# Patient Record
Sex: Male | Born: 1990 | Race: Black or African American | Hispanic: No | Marital: Single | Smoking: Light tobacco smoker
Health system: Southern US, Community
[De-identification: ages and names within clinical notes are randomized; demographics above are authoritative.]

## PROBLEM LIST (undated history)

## (undated) DIAGNOSIS — J45909 Unspecified asthma, uncomplicated: Secondary | ICD-10-CM

---

## 2013-12-29 ENCOUNTER — Emergency Department (HOSPITAL_COMMUNITY): Payer: BC Managed Care – PPO

## 2013-12-29 ENCOUNTER — Emergency Department (HOSPITAL_COMMUNITY)
Admission: EM | Admit: 2013-12-29 | Discharge: 2013-12-29 | Disposition: A | Discharge status: Home / Self Care | Payer: BC Managed Care – PPO | Attending: Emergency Medicine | Admitting: Emergency Medicine

## 2013-12-29 ENCOUNTER — Encounter (HOSPITAL_COMMUNITY): Payer: Self-pay | Admitting: Emergency Medicine

## 2013-12-29 DIAGNOSIS — IMO0002 Reserved for concepts with insufficient information to code with codable children: Secondary | ICD-10-CM | POA: Insufficient documentation

## 2013-12-29 DIAGNOSIS — J45901 Unspecified asthma with (acute) exacerbation: Secondary | ICD-10-CM | POA: Insufficient documentation

## 2013-12-29 DIAGNOSIS — R0989 Other specified symptoms and signs involving the circulatory and respiratory systems: Secondary | ICD-10-CM | POA: Diagnosis present

## 2013-12-29 DIAGNOSIS — J45909 Unspecified asthma, uncomplicated: Secondary | ICD-10-CM

## 2013-12-29 DIAGNOSIS — R0609 Other forms of dyspnea: Secondary | ICD-10-CM | POA: Diagnosis present

## 2013-12-29 HISTORY — DX: Unspecified asthma, uncomplicated: J45.909

## 2013-12-29 MED ORDER — PREDNISONE 20 MG PO TABS
60.0000 mg | ORAL_TABLET | Freq: Once | ORAL | Status: AC
  Administered 2013-12-29: 60 mg via ORAL
  Filled 2013-12-29: qty 3

## 2013-12-29 MED ORDER — PREDNISONE 10 MG PO TABS: 20.0000 mg | ORAL_TABLET | Freq: Every day | ORAL | Status: DC

## 2013-12-29 MED ORDER — ALBUTEROL SULFATE HFA 108 (90 BASE) MCG/ACT IN AERS: 1.0000 | INHALATION_SPRAY | Freq: Four times a day (QID) | RESPIRATORY_TRACT | Status: DC | PRN

## 2013-12-29 MED ORDER — ACETAMINOPHEN 325 MG PO TABS
650.0000 mg | ORAL_TABLET | Freq: Once | ORAL | Status: AC
  Administered 2013-12-29: 650 mg via ORAL
  Filled 2013-12-29: qty 2

## 2013-12-29 MED ORDER — ALBUTEROL SULFATE (2.5 MG/3ML) 0.083% IN NEBU
2.5000 mg | INHALATION_SOLUTION | RESPIRATORY_TRACT | Status: DC | PRN
  Administered 2013-12-29 (×2): 2.5 mg via RESPIRATORY_TRACT
  Filled 2013-12-29 (×2): qty 3

## 2013-12-29 NOTE — Discharge Instructions (Signed)
Asthma, Adult Asthma is a recurring condition in which the airways tighten and narrow. Asthma can make it difficult to breathe. It can cause coughing, wheezing, and shortness of breath. Asthma episodes (also called asthma attacks) range from minor to life-threatening. Asthma cannot be cured, but medicines and lifestyle changes can help control it. CAUSES Asthma is believed to be caused by inherited (genetic) and environmental factors, but its exact cause is unknown. Asthma may be triggered by allergens, lung infections, or irritants in the air. Asthma triggers are different for each person. Common triggers include:   Animal dander.  Dust mites.  Cockroaches.  Pollen from trees or grass.  Mold.  Smoke.  Air pollutants such as dust, household cleaners, hair sprays, aerosol sprays, paint fumes, strong chemicals, or strong odors.  Cold air, weather changes, and winds (which increase molds and pollens in the air).  Strong emotional expressions such as crying or laughing hard.  Stress.  Certain medicines (such as aspirin) or types of drugs (such as beta-blockers).  Sulfites in foods and drinks. Foods and drinks that may contain sulfites include dried fruit, potato chips, and sparkling grape juice.  Infections or inflammatory conditions such as the flu, a cold, or an inflammation of the nasal membranes (rhinitis).  Gastroesophageal reflux disease (GERD).  Exercise or strenuous activity. SYMPTOMS Symptoms may occur immediately after asthma is triggered or many hours later. Symptoms include:  Wheezing.  Excessive nighttime or early morning coughing.  Frequent or severe coughing with a common cold.  Chest tightness.  Shortness of breath. DIAGNOSIS  The diagnosis of asthma is made by a review of your medical history and a physical exam. Tests may also be performed. These may include:  Lung function studies. These tests show how much air you breath in and out.  Allergy  tests.  Imaging tests such as X-rays. TREATMENT  Asthma cannot be cured, but it can usually be controlled. Treatment involves identifying and avoiding your asthma triggers. It also involves medicines. There are 2 classes of medicine used for asthma treatment:   Controller medicines. These prevent asthma symptoms from occurring. They are usually taken every day.  Reliever or rescue medicines. These quickly relieve asthma symptoms. They are used as needed and provide short-term relief. Your health care provider will help you create an asthma action plan. An asthma action plan is a written plan for managing and treating your asthma attacks. It includes a list of your asthma triggers and how they may be avoided. It also includes information on when medicines should be taken and when their dosage should be changed. An action plan may also involve the use of a device called a peak flow meter. A peak flow meter measures how well the lungs are working. It helps you monitor your condition. HOME CARE INSTRUCTIONS   Take medicine as directed by your health care provider. Speak with your health care provider if you have questions about how or when to take the medicines.  Use a peak flow meter as directed by your health care provider. Record and keep track of readings.  Understand and use the action plan to help minimize or stop an asthma attack without needing to seek medical care.  Control your home environment in the following ways to help prevent asthma attacks:  Do not smoke. Avoid being exposed to secondhand smoke.  Change your heating and air conditioning filter regularly.  Limit your use of fireplaces and wood stoves.  Get rid of pests (such as roaches and   mice) and their droppings.  Throw away plants if you see mold on them.  Clean your floors and dust regularly. Use unscented cleaning products.  Try to have someone else vacuum for you regularly. Stay out of rooms while they are being  vacuumed and for a short while afterward. If you vacuum, use a dust mask from a hardware store, a double-layered or microfilter vacuum cleaner bag, or a vacuum cleaner with a HEPA filter.  Replace carpet with wood, tile, or vinyl flooring. Carpet can trap dander and dust.  Use allergy-proof pillows, mattress covers, and box spring covers.  Wash bed sheets and blankets every week in hot water and dry them in a dryer.  Use blankets that are made of polyester or cotton.  Clean bathrooms and kitchens with bleach. If possible, have someone repaint the walls in these rooms with mold-resistant paint. Keep out of the rooms that are being cleaned and painted.  Wash hands frequently. SEEK MEDICAL CARE IF:   You have wheezing, shortness of breath, or a cough even if taking medicine to prevent attacks.  The colored mucus you cough up (sputum) is thicker than usual.  Your sputum changes from clear or white to yellow, green, gray, or bloody.  You have any problems that may be related to the medicines you are taking (such as a rash, itching, swelling, or trouble breathing).  You are using a reliever medicine more than 2 3 times per week.  Your peak flow is still at 50 79% of you personal best after following your action plan for 1 hour. SEEK IMMEDIATE MEDICAL CARE IF:   You seem to be getting worse and are unresponsive to treatment during an asthma attack.  You are short of breath even at rest.  You get short of breath when doing very little physical activity.  You have difficulty eating, drinking, or talking due to asthma symptoms.  You develop chest pain.  You develop a fast heartbeat.  You have a bluish color to your lips or fingernails.  You are lightheaded, dizzy, or faint.  Your peak flow is less than 50% of your personal best.  You have a fever or persistent symptoms for more than 2 3 days.  You have a fever and symptoms suddenly get worse. MAKE SURE YOU:   Understand these  instructions.  Will watch your condition.  Will get help right away if you are not doing well or get worse. Document Released: 10/17/2005 Document Revised: 06/19/2013 Document Reviewed: 05/16/2013 [employer] Patient Information 2014 [employer].  

## 2013-12-29 NOTE — ED Provider Notes (Signed)
CSN: [id number]     Arrival date & time 12/29/13  1708 History   First MD Initiated Contact with Patient 12/29/13 1730     Chief Complaint  Patient presents with  . RUQ pain   . shob      HPI  Patient presents with dyspnea today. Has history of asthma as a kid. He has not had problems for over 10 years. 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 States he started getting short of breath this morning. He is "sore" in the right lower chest. Felt this was more from breathing or coughing. He felt more dyspneic and presents here  Past Medical History  Diagnosis Date  . Asthma    History reviewed. No pertinent past surgical history. No family history on file. History  Substance Use Topics  . Smoking status: Light Tobacco Smoker    Types: Cigars  . Smokeless tobacco: Never Used  . Alcohol Use: Yes     Comment: social     Review of Systems  Constitutional: Negative for fever, chills, diaphoresis, appetite change and fatigue.  HENT: Negative for mouth sores, sore throat and trouble swallowing.   Eyes: Negative for visual disturbance.  Respiratory: Positive for cough, shortness of breath and wheezing. Negative for chest tightness.   Cardiovascular: Positive for chest pain.  Gastrointestinal: Negative for nausea, vomiting, abdominal pain, diarrhea and abdominal distention.  Endocrine: Negative for polydipsia, polyphagia and polyuria.  Genitourinary: Negative for dysuria, frequency and hematuria.  Musculoskeletal: Negative for gait problem.  Skin: Negative for color change, pallor and rash.  Neurological: Negative for dizziness, syncope, light-headedness and headaches.  Hematological: Does not bruise/bleed easily.  Psychiatric/Behavioral: Negative for behavioral problems and confusion.      Allergies  Review of patient's allergies indicates no known allergies.  Home Medications   Current Outpatient Rx  Name  Route  Sig  Dispense  Refill  . Multiple Vitamin (MULTIVITAMIN  WITH MINERALS) TABS tablet   Oral   Take 1 tablet by mouth daily.         Marland Kitchen. albuterol (PROVENTIL HFA;VENTOLIN HFA) 108 (90 BASE) MCG/ACT inhaler   Inhalation   Inhale 1-2 puffs into the lungs every 6 (six) hours as needed for wheezing.   1 Inhaler   0   . predniSONE (DELTASONE) 10 MG tablet   Oral   Take 2 tablets (20 mg total) by mouth daily.   10 tablet   0    BP 130/70  Pulse 73  Temp(Src) 98.7 F (37.1 C) (Oral)  Resp 20  SpO2 98% Physical Exam  Constitutional: He is oriented to person, place, and time. He appears well-developed and well-nourished. No distress.  HENT:  Head: Normocephalic.  Eyes: Conjunctivae are normal. Pupils are equal, round, and reactive to light. No scleral icterus.  Neck: Normal range of motion. Neck supple. No thyromegaly present.  Cardiovascular: Normal rate and regular rhythm.  Exam reveals no gallop and no friction rub.   No murmur heard. Pulmonary/Chest: Effort normal. No respiratory distress. He has wheezes in the right upper field, the right lower field, the left upper field, the left middle field and the left lower field. He has no rales.  Mild prolongation. Overall good air exchange. Percent saturations. No focal diminished breath sounds.  Abdominal: Soft. Bowel sounds are normal. He exhibits no distension. There is no tenderness. There is no rebound.  Musculoskeletal: Normal range of motion.  Neurological: He is alert and oriented to person, place, and  time.  Skin: Skin is warm and dry. No rash noted.  Psychiatric: He has a normal mood and affect. His behavior is normal.    ED Course  Procedures (including critical care time) Labs Review Labs Reviewed - No data to display Imaging Review Dg Chest 2 View  12/29/2013   CLINICAL DATA:  Shortness of breath and chest pain.  EXAM: CHEST  2 VIEW  COMPARISON:  None.  FINDINGS: The heart size and mediastinal contours are within normal limits. Both lungs are clear. The visualized skeletal  structures are unremarkable.  IMPRESSION: No active cardiopulmonary disease.   Electronically Signed   By: Elberta Fortis M.D.   On: 12/29/2013 18:41     EKG Interpretation None      MDM   Final diagnoses:  Asthma    Patient receiving second breathing treatment. Had marked subjective relief after the first. On exam now is lungs are clear moving air well oxygenated. X-ray shows no acute processes. Plan to discharge home for corticosteroids, when necessary albuterol    Rolland Porter, MD 12/29/13 1905

## 2013-12-29 NOTE — ED Notes (Signed)
Pt states he was at work and and started having RUQ pain and shob and coughing a lot. Pt states he was fine when he got up this morning. Pt has PMH asthma when he was young and hasnt had problems in years.

## 2014-06-19 ENCOUNTER — Emergency Department (HOSPITAL_COMMUNITY)
Admission: EM | Admit: 2014-06-19 | Discharge: 2014-06-19 | Disposition: A | Discharge status: Home / Self Care | Payer: No Typology Code available for payment source | Attending: Emergency Medicine | Admitting: Emergency Medicine

## 2014-06-19 ENCOUNTER — Emergency Department (HOSPITAL_COMMUNITY): Payer: No Typology Code available for payment source

## 2014-06-19 ENCOUNTER — Encounter (HOSPITAL_COMMUNITY): Payer: Self-pay | Admitting: Emergency Medicine

## 2014-06-19 DIAGNOSIS — Z79899 Other long term (current) drug therapy: Secondary | ICD-10-CM | POA: Diagnosis not present

## 2014-06-19 DIAGNOSIS — Y92410 Unspecified street and highway as the place of occurrence of the external cause: Secondary | ICD-10-CM | POA: Diagnosis not present

## 2014-06-19 DIAGNOSIS — M25561 Pain in right knee: Secondary | ICD-10-CM

## 2014-06-19 DIAGNOSIS — S8990XA Unspecified injury of unspecified lower leg, initial encounter: Secondary | ICD-10-CM | POA: Insufficient documentation

## 2014-06-19 DIAGNOSIS — IMO0002 Reserved for concepts with insufficient information to code with codable children: Secondary | ICD-10-CM | POA: Diagnosis not present

## 2014-06-19 DIAGNOSIS — Y9389 Activity, other specified: Secondary | ICD-10-CM | POA: Insufficient documentation

## 2014-06-19 DIAGNOSIS — J45909 Unspecified asthma, uncomplicated: Secondary | ICD-10-CM | POA: Insufficient documentation

## 2014-06-19 DIAGNOSIS — F17200 Nicotine dependence, unspecified, uncomplicated: Secondary | ICD-10-CM | POA: Diagnosis not present

## 2014-06-19 DIAGNOSIS — S99919A Unspecified injury of unspecified ankle, initial encounter: Principal | ICD-10-CM

## 2014-06-19 DIAGNOSIS — S99929A Unspecified injury of unspecified foot, initial encounter: Principal | ICD-10-CM
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
## 2014-06-21 NOTE — ED Provider Notes (Signed)
Medical screening examination/treatment/procedure(s) were performed by non-physician practitioner and as supervising physician I was immediately available for consultation/collaboration.   EKG Interpretation None        Margarine Grosshans, DO 06/21/14 1555 

## 2014-07-10 ENCOUNTER — Encounter (HOSPITAL_COMMUNITY): Payer: Self-pay | Admitting: Emergency Medicine

## 2014-07-10 ENCOUNTER — Emergency Department (INDEPENDENT_AMBULATORY_CARE_PROVIDER_SITE_OTHER)
Admission: EM | Admit: 2014-07-10 | Discharge: 2014-07-10 | Disposition: A | Discharge status: Home / Self Care | Payer: Self-pay | Source: Home / Self Care | Attending: Family Medicine | Admitting: Family Medicine

## 2014-07-10 DIAGNOSIS — J40 Bronchitis, not specified as acute or chronic: Secondary | ICD-10-CM

## 2014-07-10 MED ORDER — IPRATROPIUM-ALBUTEROL 0.5-2.5 (3) MG/3ML IN SOLN
RESPIRATORY_TRACT | Status: AC
  Filled 2014-07-10: qty 3

## 2014-07-10 MED ORDER — IPRATROPIUM-ALBUTEROL 0.5-2.5 (3) MG/3ML IN SOLN
3.0000 mL | Freq: Once | RESPIRATORY_TRACT | Status: AC
  Administered 2014-07-10: 3 mL via RESPIRATORY_TRACT

## 2014-07-10 MED ORDER — ALBUTEROL SULFATE HFA 108 (90 BASE) MCG/ACT IN AERS: 1.0000 | INHALATION_SPRAY | Freq: Four times a day (QID) | RESPIRATORY_TRACT | Status: DC | PRN

## 2014-07-10 MED ORDER — PREDNISONE 10 MG PO TABS: 30.0000 mg | ORAL_TABLET | Freq: Every day | ORAL | Status: DC

## 2014-07-10 NOTE — Discharge Instructions (Signed)
Thank you for coming in today. °Call or go to the emergency room if you get worse, have trouble breathing, have chest pains, or palpitations.  ° °Acute Bronchitis °Bronchitis is inflammation of the airways that extend from the windpipe into the lungs (bronchi). The inflammation often causes mucus to develop. This leads to a cough, which is the most common symptom of bronchitis.  °In acute bronchitis, the condition usually develops suddenly and goes away over time, usually in a couple weeks. Smoking, allergies, and asthma can make bronchitis worse. Repeated episodes of bronchitis may cause further lung problems.  °CAUSES °Acute bronchitis is most often caused by the same virus that causes a cold. The virus can spread from person to person (contagious) through coughing, sneezing, and touching contaminated objects. °SIGNS AND SYMPTOMS  °· Cough.   °· Fever.   °· Coughing up mucus.   °· Body aches.   °· Chest congestion.   °· Chills.   °· Shortness of breath.   °· Sore throat.   °DIAGNOSIS  °Acute bronchitis is usually diagnosed through a physical exam. Your health care provider will also ask you questions about your medical history. Tests, such as chest X-rays, are sometimes done to rule out other conditions.  °TREATMENT  °Acute bronchitis usually goes away in a couple weeks. Oftentimes, no medical treatment is necessary. Medicines are sometimes given for relief of fever or cough. Antibiotic medicines are usually not needed but may be prescribed in certain situations. In some cases, an inhaler may be recommended to help reduce shortness of breath and control the cough. A cool mist vaporizer may also be used to help thin bronchial secretions and make it easier to clear the chest.  °HOME CARE INSTRUCTIONS °· Get plenty of rest.   °· Drink enough fluids to keep your urine clear or pale yellow (unless you have a medical condition that requires fluid restriction). Increasing fluids may help thin your respiratory secretions  (sputum) and reduce chest congestion, and it will prevent dehydration.   °· Take medicines only as directed by your health care provider. °· If you were prescribed an antibiotic medicine, finish it all even if you start to feel better. °· Avoid smoking and secondhand smoke. Exposure to cigarette smoke or irritating chemicals will make bronchitis worse. If you are a smoker, consider using nicotine gum or skin patches to help control withdrawal symptoms. Quitting smoking will help your lungs heal faster.   °· Reduce the chances of another bout of acute bronchitis by washing your hands frequently, avoiding people with cold symptoms, and trying not to touch your hands to your mouth, nose, or eyes.   °· Keep all follow-up visits as directed by your health care provider.   °SEEK MEDICAL CARE IF: °Your symptoms do not improve after 1 week of treatment.  °SEEK IMMEDIATE MEDICAL CARE IF: °· You develop an increased fever or chills.   °· You have chest pain.   °· You have severe shortness of breath. °· You have bloody sputum.   °· You develop dehydration. °· You faint or repeatedly feel like you are going to pass out. °· You develop repeated vomiting. °· You develop a severe headache. °MAKE SURE YOU:  °· Understand these instructions. °· Will watch your condition. °· Will get help right away if you are not doing well or get worse. °Document Released: 11/24/2004 Document Revised: 03/03/2014 Document Reviewed: 04/09/2013 °ExitCare® Patient Information ©2015 [employer]. This information is not intended to replace advice given to you by your health care provider. Make sure you discuss any questions you have with your   health care provider. ° °

## 2014-07-10 NOTE — ED Notes (Signed)
C/o  Chest tightness.  States "I feel like my equilibrium is off".  Mild nausea.  Denies any other symptoms. X 2 wks.  Also c/o a lesion on the left upper shoulder noticed 4 days ago.  No otc meds used.

## 2014-07-10 NOTE — ED Provider Notes (Signed)
Edward Pacheco is a 23 y.o. male who presents to Urgent Care today for chest tightness. Patient has a two-week history of mild chest tightness. He notes occasional coughing and small amount of wheezing. No fevers or chills vomiting or diarrhea. No abdominal pain. Patient has not tried any medication. He additionally notes a mild sore throat. He feels well.    Past Medical History  Diagnosis Date  . Asthma    History  Substance Use Topics  . Smoking status: Light Tobacco Smoker    Types: Cigars  . Smokeless tobacco: Never Used  . Alcohol Use: Yes     Comment: social    ROS as above Medications: No current facility-administered medications for this encounter.   Current Outpatient Prescriptions  Medication Sig Dispense Refill  . albuterol (PROVENTIL HFA;VENTOLIN HFA) 108 (90 BASE) MCG/ACT inhaler Inhale 1-2 puffs into the lungs every 6 (six) hours as needed for wheezing.  1 Inhaler  0  . Multiple Vitamin (MULTIVITAMIN WITH MINERALS) TABS tablet Take 1 tablet by mouth daily.      . predniSONE (DELTASONE) 10 MG tablet Take 2 tablets (20 mg total) by mouth daily.  10 tablet  0    Exam:  Temp(Src) 98.7 F (37.1 C) (Oral)  Resp 14  SpO2 100% Gen: Well NAD HEENT: EOMI,  MMM normal posterior pharynx Lungs: Normal work of breathing. CTABL Heart: RRR no MRG Abd: NABS, Soft. Nondistended, Nontender Exts: Brisk capillary refill, warm and well perfused.   Patient was given a DuoNeb nebulizer treatment and felt better  No results found for this or any previous visit (from the past 24 hour(s)). No results found.  Assessment and Plan: 23 y.o. male with viral URI complicated by history of asthma. Possible bronchitis. Plan to treat with prednisone and albuterol.  Discussed warning signs or symptoms. Please see discharge instructions. Patient expresses understanding.   This note was created using Conservation officer, historic buildings. Any transcription errors are unintended.    Rodolph Bong,  MD 07/10/14 [phone]

## 2014-11-16 ENCOUNTER — Emergency Department (HOSPITAL_COMMUNITY)
Admission: EM | Admit: 2014-11-16 | Discharge: 2014-11-16 | Disposition: A | Discharge status: Home / Self Care | Payer: Self-pay | Attending: Emergency Medicine | Admitting: Emergency Medicine

## 2014-11-16 ENCOUNTER — Encounter (HOSPITAL_COMMUNITY): Payer: Self-pay | Admitting: Emergency Medicine

## 2014-11-16 DIAGNOSIS — L2389 Allergic contact dermatitis due to other agents: Secondary | ICD-10-CM | POA: Insufficient documentation

## 2014-11-16 DIAGNOSIS — Z79899 Other long term (current) drug therapy: Secondary | ICD-10-CM | POA: Insufficient documentation

## 2014-11-16 DIAGNOSIS — Z720 Tobacco use: Secondary | ICD-10-CM | POA: Insufficient documentation

## 2014-11-16 DIAGNOSIS — J45909 Unspecified asthma, uncomplicated: Secondary | ICD-10-CM | POA: Insufficient documentation

## 2014-11-16 DIAGNOSIS — L259 Unspecified contact dermatitis, unspecified cause: Secondary | ICD-10-CM

## 2014-11-16 MED ORDER — HYDROCORTISONE 1 % EX CREA: TOPICAL_CREAM | CUTANEOUS | Status: DC

## 2014-11-16 NOTE — Discharge Instructions (Signed)
Contact Dermatitis °Contact dermatitis is a reaction to certain substances that touch the skin. Contact dermatitis can be either irritant contact dermatitis or allergic contact dermatitis. Irritant contact dermatitis does not require previous exposure to the substance for a reaction to occur. Allergic contact dermatitis only occurs if you have been exposed to the substance before. Upon a repeat exposure, your body reacts to the substance.  °CAUSES  °Many substances can cause contact dermatitis. Irritant dermatitis is most commonly caused by repeated exposure to mildly irritating substances, such as: °· Makeup. °· Soaps. °· Detergents. °· Bleaches. °· Acids. °· Metal salts, such as nickel. °Allergic contact dermatitis is most commonly caused by exposure to: °· Poisonous plants. °· Chemicals (deodorants, shampoos). °· Jewelry. °· Latex. °· Neomycin in triple antibiotic cream. °· Preservatives in products, including clothing. °SYMPTOMS  °The area of skin that is exposed may develop: °· Dryness or flaking. °· Redness. °· Cracks. °· Itching. °· Pain or a burning sensation. °· Blisters. °With allergic contact dermatitis, there may also be swelling in areas such as the eyelids, mouth, or genitals.  °DIAGNOSIS  °Your caregiver can usually tell what the problem is by doing a physical exam. In cases where the cause is uncertain and an allergic contact dermatitis is suspected, a patch skin test may be performed to help determine the cause of your dermatitis. °TREATMENT °Treatment includes protecting the skin from further contact with the irritating substance by avoiding that substance if possible. Barrier creams, powders, and gloves may be helpful. Your caregiver may also recommend: °· Steroid creams or ointments applied 2 times daily. For best results, soak the rash area in cool water for 20 minutes. Then apply the medicine. Cover the area with a plastic wrap. You can store the steroid cream in the refrigerator for a "chilly"  effect on your rash. That may decrease itching. Oral steroid medicines may be needed in more severe cases. °· Antibiotics or antibacterial ointments if a skin infection is present. °· Antihistamine lotion or an antihistamine taken by mouth to ease itching. °· Lubricants to keep moisture in your skin. °· Burow's solution to reduce redness and soreness or to dry a weeping rash. Mix one packet or tablet of solution in 2 cups cool water. Dip a clean washcloth in the mixture, wring it out a bit, and put it on the affected area. Leave the cloth in place for 30 minutes. Do this as often as possible throughout the day. °· Taking several cornstarch or baking soda baths daily if the area is too large to cover with a washcloth. °Harsh chemicals, such as alkalis or acids, can cause skin damage that is like a burn. You should flush your skin for 15 to 20 minutes with cold water after such an exposure. You should also seek immediate medical care after exposure. Bandages (dressings), antibiotics, and pain medicine may be needed for severely irritated skin.  °HOME CARE INSTRUCTIONS °· Avoid the substance that caused your reaction. °· Keep the area of skin that is affected away from hot water, soap, sunlight, chemicals, acidic substances, or anything else that would irritate your skin. °· Do not scratch the rash. Scratching may cause the rash to become infected. °· You may take cool baths to help stop the itching. °· Only take over-the-counter or prescription medicines as directed by your caregiver. °· See your caregiver for follow-up care as directed to make sure your skin is healing properly. °SEEK MEDICAL CARE IF:  °· Your condition is not better after 3   days of treatment. °· You seem to be getting worse. °· You see signs of infection such as swelling, tenderness, redness, soreness, or warmth in the affected area. °· You have any problems related to your medicines. °Document Released: 10/14/2000 Document Revised: 01/09/2012  Document Reviewed: 03/22/2011 °ExitCare® Patient Information ©2015 [employer]. This information is not intended to replace advice given to you by your health care provider. Make sure you discuss any questions you have with your health care provider. ° ° °Emergency Department Resource Guide °1) Find a Doctor and Pay Out of Pocket °Although you won't have to find out who is covered by your insurance plan, it is a good idea to ask around and get recommendations. You will then need to call the office and see if the doctor you have chosen will accept you as a new patient and what types of options they offer for patients who are self-pay. Some doctors offer discounts or will set up payment plans for their patients who do not have insurance, but you will need to ask so you aren't surprised when you get to your appointment. ° °2) Contact Your Local Health Department °Not all health departments have doctors that can see patients for sick visits, but many do, so it is worth a call to see if yours does. If you don't know where your local health department is, you can check in your phone book. The CDC also has a tool to help you locate your state's health department, and many state websites also have listings of all of their local health departments. ° °3) Find a Walk-in Clinic °If your illness is not likely to be very severe or complicated, you may want to try a walk in clinic. These are popping up all over the country in pharmacies, drugstores, and shopping centers. They're usually staffed by nurse practitioners or physician assistants that have been trained to treat common illnesses and complaints. They're usually fairly quick and inexpensive. However, if you have serious medical issues or chronic medical problems, these are probably not your best option. ° °No Primary Care Doctor: °- Call [facility] at  832-8000 - they can help you locate a primary care doctor that  accepts your insurance, provides certain services,  etc. °- Physician Referral Service- 1-800-533-3463 ° °Chronic Pain Problems: °Organization         Address  Phone   Notes  °[facility]  (336) 297-2271 Patients need to be referred by their primary care doctor.  ° °Medication Assistance: °Organization         Address  Phone   Notes  °[place] [facility] [address] °[place], [place] [address] (336) 641-8030 --Must be a resident of [place] °-- Must have NO insurance coverage whatsoever (no Medicaid/ Medicare, etc.) °-- The pt. MUST have a primary care doctor that directs their care regularly and follows them in the community °  °[facility]  (866) 331-1348   °[employer]  (888) 892-1162   ° °Agencies that provide inexpensive medical care: °Organization         Address  Phone   Notes  °[facility]  (336) 832-8035   °[facility]    (336) 832-7272   °[facility] [address] °[place], [place] [address] (336) 832-4777   °[facility] [address], °[place] (336) 271-4999   °[facility]    (336) 373-0678   °[facility]    (336) 272-1050   °[facility] ° [address], [place] Phone:  (336) 832-4444, Fax:  (336) 832-4440 Hours of Operation:  9 am - 6 pm, M-F.  Also accepts Medicaid/Medicare and self-pay.  °[facility] ° [address], [place] Phone: (336) 832-3150, Fax: (336) 832-3151. Hours of Operation:  8:30 am - 5:30 pm, M-F.  Also accepts Medicaid and self-pay.  °[facility] [place] [address], [place] Phone: (336) 878-6027   °[facility] [address], [place], [place] (336)723-1848, Ext. 123 Mondays & Thursdays: 7-9 AM.  First 15 patients are seen on a first come, first serve basis. °  ° °Medicaid-accepting [place] Providers: ° °Organization         Address  Phone   Notes  °[facility] [address], [place] (336) 641-2100 Also accepts self-pay patients.  °[facility] [address], [place] ° (336) 856-9996   °[facility] [address], [place] (336) 288-8857   °[facility] [address] [place] [address], [place] (336) 299-7000   °Veita Bland [address], [place]  ° (336) 373-1557 Only accepts  Access Medicaid patients after they have their name applied to their card.  ° °Self-Pay (no insurance) in [place]: ° °Organization         Address  Phone   Notes  °Sickle Cell Patients, [facility] [address], [place] (336) 832-1970   °[facility] [address], [place] (336) 832-4400   °[facility] ° [address], [place] (336) 992-4800   °[facility]/Dr. Osei-Bonsu ° [address] [place] [address], [place] or [address], [place] (336) [phone] Phone number for both [place] and [place] locations is the same.  °[facility] [address], [place] (336) 299-0000   °[facility] [address] [place] [address], [place] or [address] (336) 852-7530 °(336) 878-2260   °[facility] [address],  (336) 350-1642, phone; (336) 294-5005, fax Sees patients 1st and 3rd Saturday of every month.  Must not qualify for public or private insurance (i.e. Medicaid, Medicare, [place] Health Choice, Veterans' Benefits) • Household income should be no more than 200% of the poverty level •The clinic cannot treat you if you are pregnant or think you are pregnant • Sexually transmitted diseases are not treated at the clinic.  ° ° °Dental Care: °Organization         Address  Phone  Notes  °[facility] [address],  (336) 641-6152 Accepts children up to  age 21 who are enrolled in Medicaid or [place] Health Choice; pregnant women with a Medicaid card; and children who have applied for Medicaid or [place] Health Choice, but were declined, whose parents can pay a reduced fee at time of service.  °[facility] [place]  [address], [place] (336) 641-7733 Accepts children up to age 21 who are enrolled in Medicaid or [place] Health Choice; pregnant women with a Medicaid card; and children who have applied for Medicaid or [place] Health Choice, but were declined, whose parents can pay a reduced fee at time of service.  °  [facility] ° [address], [place] (336) 641-4533 Patients are seen by appointment only. Walk-ins are not accepted. [facility] will see patients 18 years of age and older. °Monday - Tuesday (8am-5pm) °Most Wednesdays (8:30-5pm) °$30 per visit, cash only  °[facility] ° [address], [place] (336) 641-4533 Patients are seen by appointment only. Walk-ins are not accepted. [facility] will see patients 18 years of age and older. °One Wednesday Evening (Monthly: Volunteer Based).  $30 per visit, cash only  °[facility]  (919) 537-3737 for adults; Children under age 4, call Graduate Pediatric Dentistry at ([phone]) [phone]. Children aged 4-14, please call (919) [phone] to request a pediatric application. ° Dental services are provided in all areas of dental care including fillings, crowns and bridges, complete and partial dentures, implants, gum treatment, root canals, and extractions. Preventive care is also provided. Treatment is provided to both adults and children. °Patients are selected via a lottery and there is often a waiting list. °  °[facility] [address], °[place] ° (336) 763-8833 [email or web address] °  °[facility] [address], [place], [place] (336)723-1848, Ext. 123 Second and Fourth Thursday of  each month, opens at 6:30 AM; Clinic ends at 9 AM.  Patients are seen on a first-come first-served basis, and a limited number are seen during each clinic.  ° °[facility] ° [address], [place], [place] (336) 723-7904   Eligibility Requirements °You must have lived in [place], [place], or [place] counties for at least the last three months. °  You cannot be eligible for state or federal sponsored healthcare insurance, including Veterans Administration, Medicaid, or Medicare. °  You generally cannot be eligible for healthcare insurance through your employer.  °  How to apply: °Eligibility screenings are held every Tuesday and Wednesday afternoon from 1:00 pm until 4:00 pm. You do not need an appointment for the interview!  °[facility] [address], [place], [place] 336-631-2330   °[facility]  336-342-8273   °[facility]  336-703-3100   °[facility]  336-570-6415   ° °Behavioral Health Resources in the Community: °Intensive Outpatient Programs °Organization         Address  Phone  Notes  °[facility] [address], [place], [place] 336-878-6098   °[facility] [address], [place], [place] 336-832-9800   °[facility] [address], [place], [place] ° 336-882-2125   °[facility] [address],  °[place], [place] 1-800-853-5163 or 336-641-4981   °Substance Abuse Resources °Organization         Address  Phone  Notes  °[facility]  336-882-2125   °[facility]  336-784-9470   °[facility]  336-285-9073   °[facility]  336-845-3988   °Residential & Outpatient Substance Abuse Program  1-800-659-3381   °Psychological Services °Organization         Address  Phone  Notes  °[facility]  336- 832-9600   °[facility]  336- 378-7881   °[facility] [address],  [place] 1-800-853-5163 or 336-641-4981   ° °Mobile Crisis Teams °Organization         Address  Phone  Notes  °[facility], Mobile Crisis Care Unit  1-877-626-1772   °Assertive °[facility]otherapeutic Services ° [address] [place], [place] 336-834-9664   °Sharon DeEsch [address] °[place] 336-554-5454   ° °Self-Help/Support Groups °Organization         Address  Phone             Notes  °[facility] - variety of support groups  336- 373-1402 Call for more information  °Narcotics Anonymous (NA), [facility] [address], °[place]  2 meetings at this location  ° °Residential Treatment Programs °Organization         Address  Phone  Notes  °[facility] [address],    °[place]  1-866-801-8205   °[facility] ° [address], [place], [place] 704-293-8524   °[facility] [address], [place] 336-845-3988 Admissions: 8am-3pm M-F  °[facility] [address]    °[place], [place] 336-841-1104   °[facility] [address], [place], [place] 336-379-7146   °[facility] [address]  °[place], [place] 336-285-9073   °[facility] Intensive Outpatient [address], [place], [place] 336-852-3033   °[facility] [address]  °[place], [place] 1-877-615-2722 or 336-784-9470   °[facility] [address] [place], [place] 336-227-7417 Accepts Medicaid  °[facility] [address]  °[place] 1-800-659-3381 Substance Abuse/Addiction Treatment  ° °[place] Behavioral Health Resources °Organization         Address  Phone  Notes  °[facility]  (888) 581-9988   °Julie Brannon, PhD [address] [place], [place]   (336) 349-5553 or (336) 951-0000   °[facility]   [address] °[place], [place] (336) 349-4454     °[facility] [address], [place], [place] (336) 342-8316 Insurance/Medicaid/sponsorship through [facility]  °[facility] [address]                                    [place], [place] (336) 342-8316 Therapy/tele-psych/case  °[facility] [address]  ° [place], [place] (336) 349-2233    °Dr. Arfeen  (336) 349-4544   °[facility] [place]  [employer] [place] [facility] 1) [address], [place] °2) [address], [place] °3)  [address], [place] (336) 349-3220 °(336) 342-7768 ° °(336) 342-8140   °[place] [facility] (336) 342-1394 or (336) 342-3537 (After Hours)    ° ° ° ° °

## 2014-11-16 NOTE — ED Notes (Signed)
Pt states that he has had discharge coming from spot on the shaft of his penis. Pt states that he has been bathing trying to keep it clean and states that he has been applying coco butter to the area.

## 2014-11-16 NOTE — ED Provider Notes (Addendum)
CSN: [id number]     Arrival date & time 11/16/14  1706 History   First MD Initiated Contact with Patient 11/16/14 1724     Chief Complaint  Patient presents with  . Penile Discharge     (Consider location/radiation/quality/duration/timing/severity/associated sxs/prior Treatment) HPI Comments: Having some weeping around the skin of his penis. No lesions. Patient has changed detergents.  Patient is a 24 y.o. male presenting with penile discharge. The history is provided by the patient.  Penile Discharge This is a new problem. The current episode started more than 2 days ago. The problem occurs constantly. The problem has not changed since onset.Pertinent negatives include no abdominal pain and no shortness of breath. Nothing aggravates the symptoms. Nothing relieves the symptoms.    Past Medical History  Diagnosis Date  . Asthma    History reviewed. No pertinent past surgical history. No family history on file. History  Substance Use Topics  . Smoking status: Light Tobacco Smoker    Types: Cigars  . Smokeless tobacco: Never Used  . Alcohol Use: Yes     Comment: social     Review of Systems  Constitutional: Negative for fever.  Respiratory: Negative for cough and shortness of breath.   Gastrointestinal: Negative for vomiting and abdominal pain.  Genitourinary: Positive for discharge.  All other systems reviewed and are negative.     Allergies  Peanut-containing drug products and Lactose intolerance (gi)  Home Medications   Prior to Admission medications   Medication Sig Start Date End Date Taking? Authorizing Provider  albuterol (PROVENTIL HFA;VENTOLIN HFA) 108 (90 BASE) MCG/ACT inhaler Inhale 1-2 puffs into the lungs every 6 (six) hours as needed for wheezing. 07/10/14  Yes Rodolph Bong, MD  hydrocortisone cream 1 % Apply to affected area 2 times daily 11/16/14   Elwin Mocha, MD  predniSONE (DELTASONE) 10 MG tablet Take 3 tablets (30 mg total) by mouth daily. Patient  not taking: Reported on 11/16/2014 07/10/14   Rodolph Bong, MD   BP 144/80 mmHg  Pulse 76  Temp(Src) 98.1 F (36.7 C) (Oral)  Resp 17  Ht  (1.702 m)  Wt 174 lb 5 oz (79.068 kg)  BMI 27.29 kg/m2  SpO2 100% Physical Exam  Constitutional: He is oriented to person, place, and time. He appears well-developed and well-nourished. No distress.  HENT:  Head: Normocephalic and atraumatic.  Mouth/Throat: No oropharyngeal exudate.  Eyes: EOM are normal. Pupils are equal, round, and reactive to light.  Neck: Normal range of motion. Neck supple.  Cardiovascular: Normal rate and regular rhythm.  Exam reveals no friction rub.   No murmur heard. Pulmonary/Chest: Effort normal and breath sounds normal. No respiratory distress. He has no wheezes. He has no rales.  Abdominal: He exhibits no distension. There is no tenderness. There is no rebound. Hernia confirmed negative in the right inguinal area and confirmed negative in the left inguinal area.  Genitourinary: Right testis shows no mass, no swelling and no tenderness. Left testis shows no mass, no swelling and no tenderness.  No penile discharge. No penile lesions Skin on shaft of penis scaly, mildly edematous, weeping. No redness, no cellulitis.  Musculoskeletal: Normal range of motion. He exhibits no edema.  Lymphadenopathy:       Right: No inguinal adenopathy present.       Left: No inguinal adenopathy present.  Neurological: He is alert and oriented to person, place, and time.  Skin: No rash noted. He is not diaphoretic.  Nursing note and  vitals reviewed.   ED Course  Procedures (including critical care time) Labs Review Labs Reviewed  RPR  HIV ANTIBODY (ROUTINE TESTING)  GC/CHLAMYDIA PROBE AMP ([place])    Imaging Review No results found.   EKG Interpretation None      MDM   Final diagnoses:  Contact dermatitis    24 year old male here with penile skin changes. No penile discharge from the urethra. No concern for  STDs, no new partners. Patient has no lesions on the penis, but over the past 3-4 days he's had itchiness and some weeping of the skin. On exam he has some mild skin edema and some scaling of the skin on the penis. No distinct lesions. Normal testicular exam. No discharge noted on my exam. He has recently changed detergents, and the skin changes look more consistent with contact dermatitis and STDs. Patient did ask and request for STD treatment testing but does not want treatment empirically. Given hydrocortisone cream for his penis, instructed to f/u with PCP, given resource guide.    Elwin MochaBlair Arel Tippen, MD 11/16/14 [id number]  Elwin MochaBlair Ebenezer Mccaskey, MD 11/16/14 [phone]

## 2014-11-17 LAB — HIV ANTIBODY (ROUTINE TESTING W REFLEX)
HIV 1/O/2 Abs-Index Value: <1
HIV-1/HIV-2 Ab: NONREACTIVE

## 2014-11-17 LAB — GC/CHLAMYDIA PROBE AMP (~~LOC~~) NOT AT ARMC
Chlamydia: NEGATIVE
Neisseria Gonorrhea: NEGATIVE

## 2014-11-24 LAB — RPR: RPR Ser Ql: NONREACTIVE

## 2014-12-02 ENCOUNTER — Encounter (HOSPITAL_COMMUNITY): Payer: Self-pay

## 2014-12-02 ENCOUNTER — Emergency Department (HOSPITAL_COMMUNITY)
Admission: EM | Admit: 2014-12-02 | Discharge: 2014-12-02 | Disposition: A | Discharge status: Home / Self Care | Payer: Self-pay | Attending: Emergency Medicine | Admitting: Emergency Medicine

## 2014-12-02 DIAGNOSIS — J45909 Unspecified asthma, uncomplicated: Secondary | ICD-10-CM | POA: Insufficient documentation

## 2014-12-02 DIAGNOSIS — Z79899 Other long term (current) drug therapy: Secondary | ICD-10-CM | POA: Insufficient documentation

## 2014-12-02 DIAGNOSIS — Z720 Tobacco use: Secondary | ICD-10-CM | POA: Insufficient documentation

## 2014-12-02 DIAGNOSIS — Z7952 Long term (current) use of systemic steroids: Secondary | ICD-10-CM | POA: Insufficient documentation

## 2014-12-02 DIAGNOSIS — L309 Dermatitis, unspecified: Secondary | ICD-10-CM | POA: Insufficient documentation

## 2014-12-02 MED ORDER — TRIAMCINOLONE ACETONIDE 0.5 % EX OINT: 1.0000 "application " | TOPICAL_OINTMENT | Freq: Two times a day (BID) | CUTANEOUS | Status: DC

## 2014-12-02 MED ORDER — AQUAPHOR EX OINT: TOPICAL_OINTMENT | CUTANEOUS | Status: DC | PRN

## 2014-12-02 MED ORDER — FLUCONAZOLE 150 MG PO TABS
150.0000 mg | ORAL_TABLET | Freq: Once | ORAL | Status: AC
  Administered 2014-12-02: 150 mg via ORAL
  Filled 2014-12-02: qty 1

## 2014-12-02 MED ORDER — DEXAMETHASONE SODIUM PHOSPHATE 10 MG/ML IJ SOLN
10.0000 mg | Freq: Once | INTRAMUSCULAR | Status: AC
  Administered 2014-12-02: 10 mg via INTRAMUSCULAR
  Filled 2014-12-02: qty 1

## 2014-12-02 MED ORDER — PREDNISONE 20 MG PO TABS: ORAL_TABLET | ORAL | Status: DC

## 2014-12-02 NOTE — ED Notes (Signed)
Pt states he has dryness and oozing out of rt hand.  Same behind ears.  Multiple locations.  Seen for contact dermatitis 2 weeks ago on genitals.  Pt has hx of excema.  Pt has been using neosporin.

## 2014-12-02 NOTE — ED Provider Notes (Signed)
CSN: [id number]     Arrival date & time 12/02/14  1121 History   First MD Initiated Contact with Patient 12/02/14 1138     Chief Complaint  Patient presents with  . Hand Pain     (Consider location/radiation/quality/duration/timing/severity/associated sxs/prior Treatment) HPI Comments: patieht with hx of eczema  Patient is a 24 y.o. male presenting with rash. The history is provided by the patient. A language interpreter was used.  Rash Location:  Head/neck, hand and torso Head/neck rash location:  L ear and R ear Hand rash location:  L wrist, R wrist, L hand, R hand, R palm, dorsum of L hand and dorsum of R hand Torso rash location:  L chest and R chest Quality: blistering, dryness, itchiness, peeling, redness, scaling, swelling and weeping   Quality: not burning and not painful   Severity:  Severe Onset quality:  Gradual Duration:  2 months Timing:  Constant Progression:  Worsening Chronicity:  New Context: not animal contact, not chemical exposure, not diapers, not eggs, not exposure to similar rash, not food, not hot tub use, not insect bite/sting, not medications, not new detergent/soap, not nuts, not plant contact, not pollen, not pregnancy, not sick contacts and not sun exposure   Relieved by:  Nothing Worsened by:  Nothing tried Ineffective treatments:  Antibiotic cream Associated symptoms: no abdominal pain, no diarrhea, no fatigue, no fever, no headaches, no hoarse voice, no induration, no joint pain, no myalgias, no nausea, no periorbital edema, no shortness of breath, no sore throat, no throat swelling, no tongue swelling, no URI, not vomiting and not wheezing     Past Medical History  Diagnosis Date  . Asthma    History reviewed. No pertinent past surgical history. History reviewed. No pertinent family history. History  Substance Use Topics  . Smoking status: Light Tobacco Smoker    Types: Cigars  . Smokeless tobacco: Never Used  . Alcohol Use: Yes   Comment: social     Review of Systems  Constitutional: Negative for fever and fatigue.  HENT: Negative for hoarse voice and sore throat.   Respiratory: Negative for shortness of breath and wheezing.   Gastrointestinal: Negative for nausea, vomiting, abdominal pain and diarrhea.  Musculoskeletal: Negative for myalgias and arthralgias.  Skin: Positive for rash.  Neurological: Negative for headaches.      Allergies  Peanut-containing drug products and Lactose intolerance (gi)  Home Medications   Prior to Admission medications   Medication Sig Start Date End Date Taking? Authorizing Provider  albuterol (PROVENTIL HFA;VENTOLIN HFA) 108 (90 BASE) MCG/ACT inhaler Inhale 1-2 puffs into the lungs every 6 (six) hours as needed for wheezing. 07/10/14  Yes Rodolph Bong, MD  hydrocortisone cream 1 % Apply to affected area 2 times daily Patient not taking: Reported on 12/02/2014 11/16/14   Elwin Mocha, MD  predniSONE (DELTASONE) 10 MG tablet Take 3 tablets (30 mg total) by mouth daily. Patient not taking: Reported on 11/16/2014 07/10/14   Rodolph Bong, MD   BP 134/76 mmHg  Pulse 82  Temp(Src) 98.3 F (36.8 C) (Oral)  Resp 18  SpO2 98% Physical Exam  Constitutional: He appears well-developed and well-nourished. No distress.  HENT:  Head: Normocephalic and atraumatic.  Eyes: Conjunctivae are normal. No scleral icterus.  Neck: Normal range of motion. Neck supple.  Cardiovascular: Normal rate, regular rhythm and normal heart sounds.   Pulmonary/Chest: Effort normal and breath sounds normal. No respiratory distress.  Abdominal: Soft. There is no tenderness.  Musculoskeletal: He  exhibits no edema.  Neurological: He is alert.  Skin: Skin is warm and dry. He is not diaphoretic.  Multiple areas of hypertrophic, hyperkeratotic, confluent regions on the abdomen, arms, ears, and hands. There are small and larger confluent papules. The left hand has areas of scaling, cracking and weeping. It is  nontender to palpation and there is no purulent discharge. There is some scaling on the palmar surface of the right hand. Bilateral ears with swelling, papular lesions and weeping from the region posterior to the pinna. No signs of infection  Psychiatric: His behavior is normal.  Nursing note and vitals reviewed.   ED Course  Procedures (including critical care time) Labs Review Labs Reviewed - No data to display  Imaging Review No results found.   EKG Interpretation None      MDM   Final diagnoses:  Eczema   Patient here with complaint of itching rash, he denies hand pain. He denies any systemic symptoms of infection. He has a history of eczema but states it has not been this bad in his ears. He frequently washes his hands as he works at [employer]. He denies changes in lotions, soaps or detergents. He denies an allergy to latex, although he frequently wears gloves at work. He was seen last month with a complaint of rash at the base of the penis which is similar to rash present on the rest of his body. Evaluation by Dr. Gordy LevanWalton suggested eczematous eruption. The differential diagnosis includes Behcets syndrome. Patient may have a component of tinea manu. Patient will be given an oral Diflucan here, shot of Decadron. He will be discharged with a 2 week taper of steroid, triamcinolone, and barrier creams. I have instructed the patient fully and how to apply the creams. Suggest follow-up strongly with dermatology.    Arthor Captainbigail Desmin Daleo, PA-C 12/02/14 1211  Gwyneth SproutWhitney Plunkett, MD 12/02/14 [phone]

## 2014-12-02 NOTE — Discharge Instructions (Signed)
Eczema °Eczema, also called atopic dermatitis, is a skin disorder that causes inflammation of the skin. It causes a red rash and dry, scaly skin. The skin becomes very itchy. Eczema is generally worse during the cooler winter months and often improves with the warmth of summer. Eczema usually starts showing signs in infancy. Some children outgrow eczema, but it may last through adulthood.  °CAUSES  °The exact cause of eczema is not known, but it appears to run in families. People with eczema often have a family history of eczema, allergies, asthma, or hay fever. Eczema is not contagious. °Flare-ups of the condition may be caused by:  °· Contact with something you are sensitive or allergic to.   °· Stress. °SIGNS AND SYMPTOMS °· Dry, scaly skin.   °· Red, itchy rash.   °· Itchiness. This may occur before the skin rash and may be very intense.   °DIAGNOSIS  °The diagnosis of eczema is usually made based on symptoms and medical history. °TREATMENT  °Eczema cannot be cured, but symptoms usually can be controlled with treatment and other strategies. A treatment plan might include: °· Controlling the itching and scratching.   °¨ Use over-the-counter antihistamines as directed for itching. This is especially useful at night when the itching tends to be worse.   °¨ Use over-the-counter steroid creams as directed for itching.   °¨ Avoid scratching. Scratching makes the rash and itching worse. It may also result in a skin infection (impetigo) due to a break in the skin caused by scratching.   °· Keeping the skin well moisturized with creams every day. This will seal in moisture and help prevent dryness. Lotions that contain alcohol and water should be avoided because they can dry the skin.   °· Limiting exposure to things that you are sensitive or allergic to (allergens).   °· Recognizing situations that cause stress.   °· Developing a plan to manage stress.   °HOME CARE INSTRUCTIONS  °· Only take over-the-counter or  prescription medicines as directed by your health care provider.   °· Do not use anything on the skin without checking with your health care provider.   °· Keep baths or showers short (5 minutes) in warm (not hot) water. Use mild cleansers for bathing. These should be unscented. You may add nonperfumed bath oil to the bath water. It is best to avoid soap and bubble bath.   °· Immediately after a bath or shower, when the skin is still damp, apply a moisturizing ointment to the entire body. This ointment should be a petroleum ointment. This will seal in moisture and help prevent dryness. The thicker the ointment, the better. These should be unscented.   °· Keep fingernails cut short. Children with eczema may need to wear soft gloves or mittens at night after applying an ointment.   °· Dress in clothes made of cotton or cotton blends. Dress lightly, because heat increases itching.   °· A child with eczema should stay away from anyone with fever blisters or cold sores. The virus that causes fever blisters (herpes simplex) can cause a serious skin infection in children with eczema. °SEEK MEDICAL CARE IF:  °· Your itching interferes with sleep.   °· Your rash gets worse or is not better within 1 week after starting treatment.   °· You see pus or soft yellow scabs in the rash area.   °· You have a fever.   °· You have a rash flare-up after contact with someone who has fever blisters.   °Document Released: 10/14/2000 Document Revised: 08/07/2013 Document Reviewed: 05/20/2013 °ExitCare® Patient Information ©2015 [employer]. This information   is not intended to replace advice given to you by your health care provider. Make sure you discuss any questions you have with your health care provider. ° °Hand Dermatitis °Hand dermatitis (dyshidrotic eczema) is a skin condition in which small, itchy, raised dots or fluid-filled blisters form over the palms of the hands. Outbreaks of hand dermatitis can last 3 to 4 weeks. °CAUSES    °The cause of hand dermatitis is unknown. However, it occurs most often in patients with a history of allergies such as: °· Hay fever. °· Allergic asthma. °· Allergies to latex. °Chemical exposure, injuries, and environmental irritants can make hand dermatitis worse. Washing your hands too frequently can remove natural oils, which can dry out the skin and contribute to outbreaks of hand dermatitis. °SYMPTOMS  °The most common symptom of hand dermatitis is intense itching. Cracks or grooves (fissures) on the fingers can also develop. Affected areas can be painful, especially areas where large blisters have formed. °DIAGNOSIS °Your caregiver can usually tell what the problem is by doing a physical exam. °PREVENTION °· Avoid excessive hand washing. °· Avoid the use of harsh chemicals. °· Wear protective gloves when handling products that can irritate your skin. °TREATMENT  °Steroid creams and ointments, such as over-the-counter 1% hydrocortisone cream, can reduce inflammation and improve moisture retention. These should be applied at least 2 to 4 times per day. Your caregiver may ask you to use a stronger prescription steroid cream to help speed the healing of blistered and cracked skin. In severe cases, oral steroid medicine may be needed. If you have an infection, antibiotics may be needed. Your caregiver may also prescribe antihistamines. These medicines help reduce itching. °HOME CARE INSTRUCTIONS °· Only take over-the-counter or prescription medicines as directed by your caregiver. °· You may use wet or cold compresses. This can help: °¨ Alleviate itching. °¨ Increase the effectiveness of topical creams. °¨ Minimize blisters. °SEEK MEDICAL CARE IF: °· The rash is not better after 1 week of treatment. °· Signs of infection develop, such as redness, tenderness, or yellowish-white fluid (pus). °· The rash is spreading. °Document Released: 10/17/2005 Document Revised: 01/09/2012 Document Reviewed:  03/16/2011 °ExitCare® Patient Information ©2015 [employer]. This information is not intended to replace advice given to you by your health care provider. Make sure you discuss any questions you have with your health care provider. ° °

## 2015-02-06 ENCOUNTER — Emergency Department (HOSPITAL_COMMUNITY)
Admission: EM | Admit: 2015-02-06 | Discharge: 2015-02-06 | Disposition: A | Discharge status: Home / Self Care | Payer: No Typology Code available for payment source | Attending: Emergency Medicine | Admitting: Emergency Medicine

## 2015-02-06 ENCOUNTER — Encounter (HOSPITAL_COMMUNITY): Payer: Self-pay | Admitting: *Deleted

## 2015-02-06 ENCOUNTER — Emergency Department (HOSPITAL_COMMUNITY): Payer: No Typology Code available for payment source

## 2015-02-06 DIAGNOSIS — Y92410 Unspecified street and highway as the place of occurrence of the external cause: Secondary | ICD-10-CM | POA: Diagnosis not present

## 2015-02-06 DIAGNOSIS — Y998 Other external cause status: Secondary | ICD-10-CM | POA: Insufficient documentation

## 2015-02-06 DIAGNOSIS — S299XXA Unspecified injury of thorax, initial encounter: Secondary | ICD-10-CM | POA: Diagnosis not present

## 2015-02-06 DIAGNOSIS — J45909 Unspecified asthma, uncomplicated: Secondary | ICD-10-CM | POA: Diagnosis not present

## 2015-02-06 DIAGNOSIS — Z79899 Other long term (current) drug therapy: Secondary | ICD-10-CM | POA: Diagnosis not present

## 2015-02-06 DIAGNOSIS — Z7952 Long term (current) use of systemic steroids: Secondary | ICD-10-CM | POA: Insufficient documentation

## 2015-02-06 DIAGNOSIS — Z720 Tobacco use: Secondary | ICD-10-CM | POA: Insufficient documentation

## 2015-02-06 DIAGNOSIS — Y9389 Activity, other specified: Secondary | ICD-10-CM | POA: Diagnosis not present

## 2015-02-06 DIAGNOSIS — R55 Syncope and collapse: Secondary | ICD-10-CM | POA: Insufficient documentation

## 2015-02-06 DIAGNOSIS — Z23 Encounter for immunization: Secondary | ICD-10-CM | POA: Insufficient documentation

## 2015-02-06 DIAGNOSIS — S199XXA Unspecified injury of neck, initial encounter: Secondary | ICD-10-CM | POA: Insufficient documentation

## 2015-02-06 LAB — COMPREHENSIVE METABOLIC PANEL
ALBUMIN: 4.3 g/dL (ref 3.5–5.2)
ALT: 18 U/L (ref 0–53)
ANION GAP: 6 (ref 5–15)
AST: 35 U/L (ref 0–37)
Alkaline Phosphatase: 73 U/L (ref 39–117)
BUN: 9 mg/dL (ref 6–23)
CO2: 30 mmol/L (ref 19–32)
CREATININE: 1.22 mg/dL (ref 0.50–1.35)
Calcium: 9.4 mg/dL (ref 8.4–10.5)
Chloride: 104 mmol/L (ref 96–112)
GFR calc Af Amer: >90 mL/min (ref >90)
GFR calc non Af Amer: 82 mL/min — ABNORMAL LOW (ref >90)
GLUCOSE: 86 mg/dL (ref 70–99)
Potassium: 4.2 mmol/L (ref 3.5–5.1)
Sodium: 140 mmol/L (ref 135–145)
Total Bilirubin: 0.5 mg/dL (ref 0.3–1.2)
Total Protein: 7.3 g/dL (ref 6.0–8.3)

## 2015-02-06 LAB — CBC WITH DIFFERENTIAL/PLATELET
Basophils Absolute: 0 10*3/uL (ref 0.0–0.1)
Basophils Relative: 1 % (ref 0–1)
Eosinophils Absolute: 0.4 10*3/uL (ref 0.0–0.7)
Eosinophils Relative: 5 % (ref 0–5)
HEMATOCRIT: 42.4 % (ref 39.0–52.0)
Hemoglobin: 14.6 g/dL (ref 13.0–17.0)
LYMPHS PCT: 34 % (ref 12–46)
Lymphs Abs: 2.4 10*3/uL (ref 0.7–4.0)
MCH: 32.2 pg (ref 26.0–34.0)
MCHC: 34.4 g/dL (ref 30.0–36.0)
MCV: 93.4 fL (ref 78.0–100.0)
MONO ABS: 0.8 10*3/uL (ref 0.1–1.0)
Monocytes Relative: 12 % (ref 3–12)
NEUTROS PCT: 48 % (ref 43–77)
Neutro Abs: 3.5 10*3/uL (ref 1.7–7.7)
Platelets: 221 10*3/uL (ref 150–400)
RBC: 4.54 MIL/uL (ref 4.22–5.81)
RDW: 13.9 % (ref 11.5–15.5)
WBC: 7.1 10*3/uL (ref 4.0–10.5)

## 2015-02-06 LAB — I-STAT CG4 LACTIC ACID, ED: LACTIC ACID, VENOUS: 1.51 mmol/L (ref 0.5–2.0)

## 2015-02-06 MED ORDER — FENTANYL CITRATE 0.05 MG/ML IJ SOLN
100.0000 ug | Freq: Once | INTRAMUSCULAR | Status: AC
  Administered 2015-02-06: 100 ug via INTRAVENOUS
  Filled 2015-02-06: qty 2

## 2015-02-06 MED ORDER — ONDANSETRON HCL 4 MG/2ML IJ SOLN
4.0000 mg | Freq: Once | INTRAMUSCULAR | Status: AC
  Administered 2015-02-06: 4 mg via INTRAVENOUS
  Filled 2015-02-06: qty 2

## 2015-02-06 MED ORDER — TETANUS-DIPHTH-ACELL PERTUSSIS 5-2.5-18.5 LF-MCG/0.5 IM SUSP
0.5000 mL | Freq: Once | INTRAMUSCULAR | Status: AC
  Administered 2015-02-06: 0.5 mL via INTRAMUSCULAR
  Filled 2015-02-06: qty 0.5

## 2015-02-06 NOTE — ED Notes (Signed)
Patient transported to X-ray 
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
## 2015-02-09 ENCOUNTER — Encounter (HOSPITAL_COMMUNITY): Payer: Self-pay

## 2015-02-09 ENCOUNTER — Emergency Department (HOSPITAL_COMMUNITY): Payer: No Typology Code available for payment source

## 2015-02-09 ENCOUNTER — Emergency Department (HOSPITAL_COMMUNITY)
Admission: EM | Admit: 2015-02-09 | Discharge: 2015-02-09 | Disposition: A | Discharge status: Home / Self Care | Payer: No Typology Code available for payment source | Attending: Emergency Medicine | Admitting: Emergency Medicine

## 2015-02-09 DIAGNOSIS — Z87828 Personal history of other (healed) physical injury and trauma: Secondary | ICD-10-CM | POA: Diagnosis not present

## 2015-02-09 DIAGNOSIS — R55 Syncope and collapse: Secondary | ICD-10-CM | POA: Diagnosis not present

## 2015-02-09 DIAGNOSIS — R269 Unspecified abnormalities of gait and mobility: Secondary | ICD-10-CM | POA: Diagnosis not present

## 2015-02-09 DIAGNOSIS — Z7951 Long term (current) use of inhaled steroids: Secondary | ICD-10-CM | POA: Insufficient documentation

## 2015-02-09 DIAGNOSIS — R42 Dizziness and giddiness: Secondary | ICD-10-CM | POA: Diagnosis present

## 2015-02-09 DIAGNOSIS — J45909 Unspecified asthma, uncomplicated: Secondary | ICD-10-CM | POA: Insufficient documentation

## 2015-02-09 DIAGNOSIS — Z79899 Other long term (current) drug therapy: Secondary | ICD-10-CM | POA: Insufficient documentation

## 2015-02-09 DIAGNOSIS — F0781 Postconcussional syndrome: Secondary | ICD-10-CM | POA: Diagnosis not present

## 2015-02-09 DIAGNOSIS — M542 Cervicalgia: Secondary | ICD-10-CM | POA: Diagnosis not present

## 2015-02-09 DIAGNOSIS — Z720 Tobacco use: Secondary | ICD-10-CM | POA: Diagnosis not present

## 2015-02-09 MED ORDER — MECLIZINE HCL 25 MG PO TABS: 25.0000 mg | ORAL_TABLET | Freq: Three times a day (TID) | ORAL | Status: DC | PRN
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
## 2015-02-09 NOTE — Discharge Instructions (Signed)
Read the information below.  Use the prescribed medication as directed.  Please discuss all new medications with your pharmacist.  You may return to the Emergency Department at any time for worsening condition or any new symptoms that concern you.     Concussion A concussion, or closed-head injury, is a brain injury caused by a direct blow to the head or by a quick and sudden movement (jolt) of the head or neck. Concussions are usually not life-threatening. Even so, the effects of a concussion can be serious. If you have had a concussion before, you are more likely to experience concussion-like symptoms after a direct blow to the head.  CAUSES  Direct blow to the head, such as from running into another player during a soccer game, being hit in a fight, or hitting your head on a hard surface.  A jolt of the head or neck that causes the brain to move back and forth inside the skull, such as in a car crash. SIGNS AND SYMPTOMS The signs of a concussion can be hard to notice. Early on, they may be missed by you, family members, and health care providers. You may look fine but act or feel differently. Symptoms are usually temporary, but they may last for days, weeks, or even longer. Some symptoms may appear right away while others may not show up for hours or days. Every head injury is different. Symptoms include:  Mild to moderate headaches that will not go away.  A feeling of pressure inside your head.  Having more trouble than usual:  Learning or remembering things you have heard.  Answering questions.  Paying attention or concentrating.  Organizing daily tasks.  Making decisions and solving problems.  Slowness in thinking, acting or reacting, speaking, or reading.  Getting lost or being easily confused.  Feeling tired all the time or lacking energy (fatigued).  Feeling drowsy.  Sleep disturbances.  Sleeping more than usual.  Sleeping less than usual.  Trouble falling  asleep.  Trouble sleeping (insomnia).  Loss of balance or feeling lightheaded or dizzy.  Nausea or vomiting.  Numbness or tingling.  Increased sensitivity to:  Sounds.  Lights.  Distractions.  Vision problems or eyes that tire easily.  Diminished sense of taste or smell.  Ringing in the ears.  Mood changes such as feeling sad or anxious.  Becoming easily irritated or angry for little or no reason.  Lack of motivation.  Seeing or hearing things other people do not see or hear (hallucinations). DIAGNOSIS Your health care provider can usually diagnose a concussion based on a description of your injury and symptoms. He or she will ask whether you passed out (lost consciousness) and whether you are having trouble remembering events that happened right before and during your injury. Your evaluation might include:  A brain scan to look for signs of injury to the brain. Even if the test shows no injury, you may still have a concussion.  Blood tests to be sure other problems are not present. TREATMENT  Concussions are usually treated in an emergency department, in urgent care, or at a clinic. You may need to stay in the hospital overnight for further treatment.  Tell your health care provider if you are taking any medicines, including prescription medicines, over-the-counter medicines, and natural remedies. Some medicines, such as blood thinners (anticoagulants) and aspirin, may increase the chance of complications. Also tell your health care provider whether you have had alcohol or are taking illegal drugs. This information may  affect treatment.  Your health care provider will send you home with important instructions to follow.  How fast you will recover from a concussion depends on many factors. These factors include how severe your concussion is, what part of your brain was injured, your age, and how healthy you were before the concussion.  Most people with mild injuries  recover fully. Recovery can take time. In general, recovery is slower in older persons. Also, persons who have had a concussion in the past or have other medical problems may find that it takes longer to recover from their current injury. HOME CARE INSTRUCTIONS General Instructions  Carefully follow the directions your health care provider gave you.  Only take over-the-counter or prescription medicines for pain, discomfort, or fever as directed by your health care provider.  Take only those medicines that your health care provider has approved.  Do not drink alcohol until your health care provider says you are well enough to do so. Alcohol and certain other drugs may slow your recovery and can put you at risk of further injury.  If it is harder than usual to remember things, write them down.  If you are easily distracted, try to do one thing at a time. For example, do not try to watch TV while fixing dinner.  Talk with family members or close friends when making important decisions.  Keep all follow-up appointments. Repeated evaluation of your symptoms is recommended for your recovery.  Watch your symptoms and tell others to do the same. Complications sometimes occur after a concussion. Older adults with a brain injury may have a higher risk of serious complications, such as a blood clot on the brain.  Tell your teachers, school nurse, school counselor, coach, athletic trainer, or work Freight forwarder about your injury, symptoms, and restrictions. Tell them about what you can or cannot do. They should watch for:  Increased problems with attention or concentration.  Increased difficulty remembering or learning new information.  Increased time needed to complete tasks or assignments.  Increased irritability or decreased ability to cope with stress.  Increased symptoms.  Rest. Rest helps the brain to heal. Make sure you:  Get plenty of sleep at night. Avoid staying up late at night.  Keep  the same bedtime hours on weekends and weekdays.  Rest during the day. Take daytime naps or rest breaks when you feel tired.  Limit activities that require a lot of thought or concentration. These include:  Doing homework or job-related work.  Watching TV.  Working on the computer.  Avoid any situation where there is potential for another head injury (football, hockey, soccer, basketball, martial arts, downhill snow sports and horseback riding). Your condition will get worse every time you experience a concussion. You should avoid these activities until you are evaluated by the appropriate follow-up health care providers. Returning To Your Regular Activities You will need to return to your normal activities slowly, not all at once. You must give your body and brain enough time for recovery.  Do not return to sports or other athletic activities until your health care provider tells you it is safe to do so.  Ask your health care provider when you can drive, ride a bicycle, or operate heavy machinery. Your ability to react may be slower after a brain injury. Never do these activities if you are dizzy.  Ask your health care provider about when you can return to work or school. Preventing Another Concussion It is very important to avoid  another brain injury, especially before you have recovered. In rare cases, another injury can lead to permanent brain damage, brain swelling, or death. The risk of this is greatest during the first 7-10 days after a head injury. Avoid injuries by:  Wearing a seat belt when riding in a car.  Drinking alcohol only in moderation.  Wearing a helmet when biking, skiing, skateboarding, skating, or doing similar activities.  Avoiding activities that could lead to a second concussion, such as contact or recreational sports, until your health care provider says it is okay.  Taking safety measures in your home.  Remove clutter and tripping hazards from floors and  stairways.  Use grab bars in bathrooms and handrails by stairs.  Place non-slip mats on floors and in bathtubs.  Improve lighting in dim areas. SEEK MEDICAL CARE IF:  You have increased problems paying attention or concentrating.  You have increased difficulty remembering or learning new information.  You need more time to complete tasks or assignments than before.  You have increased irritability or decreased ability to cope with stress.  You have more symptoms than before. Seek medical care if you have any of the following symptoms for more than 2 weeks after your injury:  Lasting (chronic) headaches.  Dizziness or balance problems.  Nausea.  Vision problems.  Increased sensitivity to noise or light.  Depression or mood swings.  Anxiety or irritability.  Memory problems.  Difficulty concentrating or paying attention.  Sleep problems.  Feeling tired all the time. SEEK IMMEDIATE MEDICAL CARE IF:  You have severe or worsening headaches. These may be a sign of a blood clot in the brain.  You have weakness (even if only in one hand, leg, or part of the face).  You have numbness.  You have decreased coordination.  You vomit repeatedly.  You have increased sleepiness.  One pupil is larger than the other.  You have convulsions.  You have slurred speech.  You have increased confusion. This may be a sign of a blood clot in the brain.  You have increased restlessness, agitation, or irritability.  You are unable to recognize people or places.  You have neck pain.  It is difficult to wake you up.  You have unusual behavior changes.  You lose consciousness. MAKE SURE YOU:  Understand these instructions.  Will watch your condition.  Will get help right away if you are not doing well or get worse. Document Released: 01/07/2004 Document Revised: 10/22/2013 Document Reviewed: 05/09/2013 [facility] Patient Information 2015 [place], [place]. This  information is not intended to replace advice given to you by your health care provider. Make sure you discuss any questions you have with your health care provider.    Post-Concussion Syndrome Post-concussion syndrome describes the symptoms that can occur after a head injury. These symptoms can last from weeks to months. CAUSES  It is not clear why some head injuries cause post-concussion syndrome. It can occur whether your head injury was mild or severe and whether you were wearing head protection or not.  SIGNS AND SYMPTOMS  Memory difficulties.  Dizziness.  Headaches.  Double vision or blurry vision.  Sensitivity to light.  Hearing difficulties.  Depression.  Tiredness.  Weakness.  Difficulty with concentration.  Difficulty sleeping or staying asleep.  Vomiting.  Poor balance or instability on your feet.  Slow reaction time.  Difficulty learning and remembering things you have heard. DIAGNOSIS  There is no test to determine whether you have post-concussion syndrome. Your health care  provider may order an imaging scan of your brain, such as a CT scan, to check for other problems that may be causing your symptoms (such as severe injury inside your skull). TREATMENT  Usually, these problems disappear over time without medical care. Your health care provider may prescribe medicine to help ease your symptoms. It is important to follow up with a neurologist to evaluate your recovery and address any lingering symptoms or issues. HOME CARE INSTRUCTIONS   Only take over-the-counter or prescription medicines for pain, discomfort, or fever as directed by your health care provider. Do not take aspirin. Aspirin can slow blood clotting.  Sleep with your head slightly elevated to help with headaches.  Avoid any situation where there is potential for another head injury (football, hockey, soccer, basketball, martial arts, downhill snow sports, and horseback riding). Your  condition will get worse every time you experience a concussion. You should avoid these activities until you are evaluated by the appropriate follow-up health care providers.  Keep all follow-up appointments as directed by your health care provider. SEEK IMMEDIATE MEDICAL CARE IF:  You develop confusion or unusual drowsiness.  You cannot wake the injured person.  You develop nausea or persistent, forceful vomiting.  You feel like you are moving when you are not (vertigo).  You notice the injured person's eyes moving rapidly back and forth. This may be a sign of vertigo.  You have convulsions or faint.  You have severe, persistent headaches that are not relieved by medicine.  You cannot use your arms or legs normally.  Your pupils change size.  You have clear or bloody discharge from the nose or ears.  Your problems are getting worse, not better. MAKE SURE YOU:  Understand these instructions.  Will watch your condition.  Will get help right away if you are not doing well or get worse. Document Released: 04/08/2002 Document Revised: 08/07/2013 Document Reviewed: 01/22/2014 [facility] Patient Information 2015 [place], [place]. This information is not intended to replace advice given to you by your health care provider. Make sure you discuss any questions you have with your health care provider.

## 2015-03-09 ENCOUNTER — Ambulatory Visit: Payer: Self-pay | Admitting: Diagnostic Neuroimaging
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
## 2016-04-10 ENCOUNTER — Encounter (HOSPITAL_COMMUNITY): Payer: Self-pay | Admitting: Emergency Medicine

## 2016-04-10 ENCOUNTER — Ambulatory Visit (HOSPITAL_COMMUNITY)
Admission: EM | Admit: 2016-04-10 | Discharge: 2016-04-10 | Disposition: A | Discharge status: Home / Self Care | Payer: BLUE CROSS/BLUE SHIELD | Attending: Emergency Medicine | Admitting: Emergency Medicine

## 2016-04-10 DIAGNOSIS — J45909 Unspecified asthma, uncomplicated: Secondary | ICD-10-CM | POA: Insufficient documentation

## 2016-04-10 DIAGNOSIS — R21 Rash and other nonspecific skin eruption: Secondary | ICD-10-CM | POA: Insufficient documentation

## 2016-04-10 DIAGNOSIS — T148 Other injury of unspecified body region: Secondary | ICD-10-CM

## 2016-04-10 DIAGNOSIS — L089 Local infection of the skin and subcutaneous tissue, unspecified: Secondary | ICD-10-CM | POA: Diagnosis not present

## 2016-04-10 DIAGNOSIS — T148XXA Other injury of unspecified body region, initial encounter: Secondary | ICD-10-CM

## 2016-04-10 DIAGNOSIS — Z91013 Allergy to seafood: Secondary | ICD-10-CM | POA: Insufficient documentation

## 2016-04-10 DIAGNOSIS — F17290 Nicotine dependence, other tobacco product, uncomplicated: Secondary | ICD-10-CM | POA: Insufficient documentation

## 2016-04-10 MED ORDER — SULFAMETHOXAZOLE-TRIMETHOPRIM 800-160 MG PO TABS: 1.0000 | ORAL_TABLET | Freq: Two times a day (BID) | ORAL | Status: AC

## 2016-04-10 MED ORDER — CEPHALEXIN 500 MG PO CAPS: 500.0000 mg | ORAL_CAPSULE | Freq: Four times a day (QID) | ORAL | Status: DC

## 2016-04-10 NOTE — Discharge Instructions (Signed)
The wound has become infected. That is likely where the rash is coming from. Take Keflex and Bactrim as prescribed. Make sure you take the Bactrim with food. Things should start to improve in the next 2 days. If you develop fevers, vomiting, or are getting worse, please come back.
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
## 2016-04-13 LAB — AEROBIC CULTURE W GRAM STAIN (SUPERFICIAL SPECIMEN)

## 2016-04-13 LAB — AEROBIC CULTURE  (SUPERFICIAL SPECIMEN)

## 2016-04-14 ENCOUNTER — Encounter (HOSPITAL_COMMUNITY): Payer: Self-pay | Admitting: Emergency Medicine

## 2016-04-14 ENCOUNTER — Ambulatory Visit (HOSPITAL_COMMUNITY)
Admission: EM | Admit: 2016-04-14 | Discharge: 2016-04-14 | Disposition: A | Discharge status: Home / Self Care | Payer: No Typology Code available for payment source | Attending: Emergency Medicine | Admitting: Emergency Medicine

## 2016-04-14 DIAGNOSIS — Z5189 Encounter for other specified aftercare: Secondary | ICD-10-CM

## 2016-04-14 DIAGNOSIS — L089 Local infection of the skin and subcutaneous tissue, unspecified: Secondary | ICD-10-CM

## 2016-04-14 MED ORDER — POLYMYXIN B-TRIMETHOPRIM 10000-0.1 UNIT/ML-% OP SOLN: 2.0000 [drp] | Freq: Four times a day (QID) | OPHTHALMIC | Status: DC

## 2016-04-14 MED ORDER — FLUORESCEIN SODIUM 1 MG OP STRP
ORAL_STRIP | OPHTHALMIC | Status: AC
  Filled 2016-04-14: qty 1
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
## 2016-04-14 NOTE — Discharge Instructions (Signed)
Overall, things seem to be improving. Your culture did come back positive for MRSA. The antibiotics you are on are good to treat this infection. Start using the eyedrops 4 times a day for 1 week. No contacts until your eye feels better. If you develop fevers or vomiting, please go to the emergency room. Follow-up here in 2 days if things are not improving.
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
## 2016-04-16 ENCOUNTER — Emergency Department (HOSPITAL_COMMUNITY)
Admission: EM | Admit: 2016-04-16 | Discharge: 2016-04-16 | Disposition: A | Discharge status: Home / Self Care | Payer: No Typology Code available for payment source | Attending: Emergency Medicine | Admitting: Emergency Medicine

## 2016-04-16 ENCOUNTER — Encounter (HOSPITAL_COMMUNITY): Payer: Self-pay

## 2016-04-16 DIAGNOSIS — Z5189 Encounter for other specified aftercare: Secondary | ICD-10-CM

## 2016-04-16 DIAGNOSIS — F17210 Nicotine dependence, cigarettes, uncomplicated: Secondary | ICD-10-CM | POA: Insufficient documentation

## 2016-04-16 DIAGNOSIS — H109 Unspecified conjunctivitis: Secondary | ICD-10-CM | POA: Insufficient documentation

## 2016-04-16 DIAGNOSIS — J45909 Unspecified asthma, uncomplicated: Secondary | ICD-10-CM | POA: Insufficient documentation

## 2016-04-16 DIAGNOSIS — L309 Dermatitis, unspecified: Secondary | ICD-10-CM | POA: Insufficient documentation

## 2016-04-16 DIAGNOSIS — Z4801 Encounter for change or removal of surgical wound dressing: Secondary | ICD-10-CM | POA: Insufficient documentation

## 2016-04-16 MED ORDER — DEXAMETHASONE SODIUM PHOSPHATE 10 MG/ML IJ SOLN
10.0000 mg | Freq: Once | INTRAMUSCULAR | Status: AC
  Administered 2016-04-16: 10 mg via INTRAMUSCULAR
  Filled 2016-04-16: qty 1

## 2016-04-16 MED ORDER — CLINDAMYCIN HCL 150 MG PO CAPS: 450.0000 mg | ORAL_CAPSULE | Freq: Three times a day (TID) | ORAL | Status: DC

## 2016-04-16 MED ORDER — ERYTHROMYCIN 5 MG/GM OP OINT: TOPICAL_OINTMENT | OPHTHALMIC | Status: AC

## 2016-04-16 MED ORDER — DIPHENHYDRAMINE HCL 25 MG PO CAPS
50.0000 mg | ORAL_CAPSULE | Freq: Once | ORAL | Status: AC
  Administered 2016-04-16: 50 mg via ORAL
  Filled 2016-04-16: qty 2

## 2016-04-16 MED ORDER — BACITRACIN ZINC 500 UNIT/GM EX OINT
TOPICAL_OINTMENT | Freq: Two times a day (BID) | CUTANEOUS | Status: DC
  Administered 2016-04-16: 1 via TOPICAL
  Filled 2016-04-16: qty 0.9

## 2016-04-16 NOTE — Discharge Instructions (Signed)
Mr. Edward Pacheco,  Nice meeting you! Please follow-up with your. Return to the emergency department if you develop skin sloughing, fevers, nausea, vomiting, increased rash, shortness of breath, feelings of your throat closing. Feel better soon!  S. Lane HackerNicole Criston Chancellor, PA-C
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
## 2016-04-18 ENCOUNTER — Telehealth (HOSPITAL_COMMUNITY): Payer: Self-pay | Admitting: Emergency Medicine

## 2016-04-18 ENCOUNTER — Emergency Department (HOSPITAL_COMMUNITY)
Admission: EM | Admit: 2016-04-18 | Discharge: 2016-04-18 | Disposition: A | Discharge status: Home / Self Care | Payer: No Typology Code available for payment source | Attending: Emergency Medicine | Admitting: Emergency Medicine

## 2016-04-18 ENCOUNTER — Encounter (HOSPITAL_COMMUNITY): Payer: Self-pay | Admitting: Emergency Medicine

## 2016-04-18 DIAGNOSIS — Y939 Activity, unspecified: Secondary | ICD-10-CM | POA: Insufficient documentation

## 2016-04-18 DIAGNOSIS — Z22322 Carrier or suspected carrier of Methicillin resistant Staphylococcus aureus: Secondary | ICD-10-CM

## 2016-04-18 DIAGNOSIS — J45909 Unspecified asthma, uncomplicated: Secondary | ICD-10-CM | POA: Insufficient documentation

## 2016-04-18 DIAGNOSIS — Y999 Unspecified external cause status: Secondary | ICD-10-CM | POA: Insufficient documentation

## 2016-04-18 DIAGNOSIS — T310 Burns involving less than 10% of body surface: Secondary | ICD-10-CM | POA: Insufficient documentation

## 2016-04-18 DIAGNOSIS — A4902 Methicillin resistant Staphylococcus aureus infection, unspecified site: Secondary | ICD-10-CM | POA: Insufficient documentation

## 2016-04-18 DIAGNOSIS — X58XXXA Exposure to other specified factors, initial encounter: Secondary | ICD-10-CM | POA: Insufficient documentation

## 2016-04-18 DIAGNOSIS — L03811 Cellulitis of head [any part, except face]: Secondary | ICD-10-CM | POA: Insufficient documentation

## 2016-04-18 DIAGNOSIS — F17210 Nicotine dependence, cigarettes, uncomplicated: Secondary | ICD-10-CM | POA: Insufficient documentation

## 2016-04-18 DIAGNOSIS — T2112XA Burn of first degree of abdominal wall, initial encounter: Secondary | ICD-10-CM | POA: Insufficient documentation

## 2016-04-18 DIAGNOSIS — T2102XD Burn of unspecified degree of abdominal wall, subsequent encounter: Secondary | ICD-10-CM

## 2016-04-18 DIAGNOSIS — H109 Unspecified conjunctivitis: Secondary | ICD-10-CM | POA: Insufficient documentation

## 2016-04-18 DIAGNOSIS — Y929 Unspecified place or not applicable: Secondary | ICD-10-CM | POA: Insufficient documentation

## 2016-04-18 MED ORDER — DIPHENHYDRAMINE HCL 50 MG/ML IJ SOLN
25.0000 mg | Freq: Once | INTRAMUSCULAR | Status: AC
  Administered 2016-04-18: 25 mg via INTRAVENOUS
  Filled 2016-04-18: qty 1

## 2016-04-18 MED ORDER — POLYMYXIN B-TRIMETHOPRIM 10000-0.1 UNIT/ML-% OP SOLN: 1.0000 [drp] | OPHTHALMIC | Status: AC

## 2016-04-18 MED ORDER — VANCOMYCIN HCL IN DEXTROSE 1-5 GM/200ML-% IV SOLN
1000.0000 mg | Freq: Once | INTRAVENOUS | Status: AC
  Administered 2016-04-18: 1000 mg via INTRAVENOUS
  Filled 2016-04-18: qty 200

## 2016-04-18 MED ORDER — POLYMYXIN B-TRIMETHOPRIM 10000-0.1 UNIT/ML-% OP SOLN
1.0000 [drp] | OPHTHALMIC | Status: DC
  Administered 2016-04-18: 1 [drp] via OPHTHALMIC
  Filled 2016-04-18: qty 10

## 2016-04-18 MED ORDER — SILVER SULFADIAZINE 1 % EX CREA: TOPICAL_CREAM | Freq: Once | CUTANEOUS | Status: AC

## 2016-04-18 MED ORDER — SILVER SULFADIAZINE 1 % EX CREA
TOPICAL_CREAM | Freq: Once | CUTANEOUS | Status: AC
  Administered 2016-04-18: 09:00:00 via TOPICAL
  Filled 2016-04-18: qty 50

## 2016-04-18 MED ORDER — MUPIROCIN CALCIUM 2 % EX CREA
TOPICAL_CREAM | Freq: Once | CUTANEOUS | Status: AC
  Administered 2016-04-18: 10:00:00 via TOPICAL
  Filled 2016-04-18: qty 15

## 2016-04-18 NOTE — ED Provider Notes (Signed)
Medical screening examination/treatment/procedure(s) were conducted as a shared visit with non-physician practitioner(s) and myself.  I personally evaluated the patient during the encounter.   EKG Interpretation None     Agent here with worsening rash to his face. Seen here 4 days ago for similar symptoms and has been diagnosed with MRSA. He is currently taking erythromycin and clindamycin. Patient might have impetigo as well. Patient encouraged to get his Polytrim antibiotics filled for his eye. Will continue clindamycin. Also add topical antibiotics 2. Patient endorses pruritus and we'll treat with antihistamines  Lorre NickAnthony Bransyn Adami, MD 04/18/16 [phone]
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
## 2016-04-18 NOTE — ED Notes (Signed)
LM on pt's VM [phone] Need to give lab results from recent visit on 6/11 Also let pt know labs can be obtained from MyChart
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
## 2016-04-19 ENCOUNTER — Emergency Department (HOSPITAL_COMMUNITY)
Admission: EM | Admit: 2016-04-19 | Discharge: 2016-04-19 | Disposition: A | Discharge status: Home / Self Care | Payer: No Typology Code available for payment source | Attending: Emergency Medicine | Admitting: Emergency Medicine

## 2016-04-19 ENCOUNTER — Encounter (HOSPITAL_COMMUNITY): Payer: Self-pay

## 2016-04-19 DIAGNOSIS — Z79899 Other long term (current) drug therapy: Secondary | ICD-10-CM | POA: Insufficient documentation

## 2016-04-19 DIAGNOSIS — T7840XA Allergy, unspecified, initial encounter: Secondary | ICD-10-CM | POA: Insufficient documentation

## 2016-04-19 DIAGNOSIS — F17210 Nicotine dependence, cigarettes, uncomplicated: Secondary | ICD-10-CM | POA: Insufficient documentation

## 2016-04-19 DIAGNOSIS — Z791 Long term (current) use of non-steroidal anti-inflammatories (NSAID): Secondary | ICD-10-CM | POA: Insufficient documentation

## 2016-04-19 DIAGNOSIS — J45909 Unspecified asthma, uncomplicated: Secondary | ICD-10-CM | POA: Insufficient documentation

## 2016-04-19 MED ORDER — PREDNISONE 10 MG (21) PO TBPK: 10.0000 mg | ORAL_TABLET | Freq: Every day | ORAL | Status: DC

## 2016-04-19 MED ORDER — DEXAMETHASONE SODIUM PHOSPHATE 10 MG/ML IJ SOLN
10.0000 mg | Freq: Once | INTRAMUSCULAR | Status: AC
  Administered 2016-04-19: 10 mg via INTRAMUSCULAR
  Filled 2016-04-19: qty 1

## 2016-04-19 MED ORDER — FAMOTIDINE 20 MG PO TABS
40.0000 mg | ORAL_TABLET | Freq: Once | ORAL | Status: AC
  Administered 2016-04-19: 40 mg via ORAL
  Filled 2016-04-19: qty 2

## 2016-04-19 MED ORDER — DIPHENHYDRAMINE HCL 50 MG/ML IJ SOLN
50.0000 mg | Freq: Once | INTRAMUSCULAR | Status: AC
  Administered 2016-04-19: 50 mg via INTRAMUSCULAR
  Filled 2016-04-19: qty 1

## 2016-04-19 MED ORDER — FAMOTIDINE 20 MG PO TABS: 20.0000 mg | ORAL_TABLET | Freq: Two times a day (BID) | ORAL | Status: AC

## 2016-04-19 NOTE — Discharge Instructions (Signed)
Take 1-2 tablets of benadryl every 4-6 hours for the next 2-3 days as needed for itching. Return here tomorrow for a recheck if the rash is worse or sooner if you develop a fever, sores in your mouth, or any other problems   Allergies An allergy is an abnormal reaction to a substance by the body's defense system (immune system). Allergies can develop at any age. WHAT CAUSES ALLERGIES? An allergic reaction happens when the immune system mistakenly reacts to a normally harmless substance, called an allergen, as if it were harmful. The immune system releases antibodies to fight the substance. Antibodies eventually release a chemical called histamine into the bloodstream. The release of histamine is meant to protect the body from infection, but it also causes discomfort. An allergic reaction can be triggered by:  Eating an allergen.  Inhaling an allergen.  Touching an allergen. WHAT TYPES OF ALLERGIES ARE THERE? There are many types of allergies. Common types include:  Seasonal allergies. People with this type of allergy are usually allergic to substances that are only present during certain seasons, such as molds and pollens.  Food allergies.  Drug allergies.  Insect allergies.  Animal dander allergies. WHAT ARE SYMPTOMS OF ALLERGIES? Possible allergy symptoms include:  Swelling of the lips, face, tongue, mouth, or throat.  Sneezing, coughing, or wheezing.  Nasal congestion.  Tingling in the mouth.  Rash.  Itching.  Itchy, red, swollen areas of skin (hives).  Watery eyes.  Vomiting.  Diarrhea.  Dizziness.  Lightheadedness.  Fainting.  Trouble breathing or swallowing.  Chest tightness.  Rapid heartbeat. HOW ARE ALLERGIES DIAGNOSED? Allergies are diagnosed with a medical and family history and one or more of the following:  Skin tests.  Blood tests.  A food diary. A food diary is a record of all the foods and drinks you have in a day and of all the  symptoms you experience.  The results of an elimination diet. An elimination diet involves eliminating foods from your diet and then adding them back in one by one to find out if a certain food causes an allergic reaction. HOW ARE ALLERGIES TREATED? There is no cure for allergies, but allergic reactions can be treated with medicine. Severe reactions usually need to be treated at a hospital. HOW CAN REACTIONS BE PREVENTED? The best way to prevent an allergic reaction is by avoiding the substance you are allergic to. Allergy shots and medicines can also help prevent reactions in some cases. People with severe allergic reactions may be able to prevent a life-threatening reaction called anaphylaxis with a medicine given right after exposure to the allergen.   This information is not intended to replace advice given to you by your health care provider. Make sure you discuss any questions you have with your health care provider.   Document Released: 01/10/2003 Document Revised: 11/07/2014 Document Reviewed: 07/29/2014 Elsevier Interactive Patient Education [employer]2016 Elsevier Inc.

## 2016-04-19 NOTE — ED Provider Notes (Addendum)
CSN: [id number]     Arrival date & time 04/19/16  1023 History   First MD Initiated Contact with Patient 04/19/16 1205     Chief Complaint  Patient presents with  . Eye Pain     (Consider location/radiation/quality/duration/timing/severity/associated sxs/prior Treatment) HPI Comments: Patient here for recheck of facial cellulitis. Seen by myself recently passed. 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 Crusting has improved. Left eye drainage is also improved. He continues to endorse severe pruritus on his face. Denies any fever or chills. No neck pain photophobia. No visual disturbances. Rash that initially began on his abdomen is now similar on his face. Has used Benadryl occasionally for relief.  Patient is a 25 y.o. male presenting with eye pain. The history is provided by the patient and a parent.  Eye Pain    Past Medical History  Diagnosis Date  . Asthma    History reviewed. No pertinent past surgical history. History reviewed. No pertinent family history. Social History  Substance Use Topics  . Smoking status: Light Tobacco Smoker    Types: Cigars  . Smokeless tobacco: Never Used  . Alcohol Use: Yes     Comment: social     Review of Systems  Eyes: Positive for pain.  All other systems reviewed and are negative.     Allergies  Peanut-containing drug products; Lactose intolerance (gi); and Shellfish allergy  Home Medications   Prior to Admission medications   Medication Sig Start Date End Date Taking? Authorizing Provider  clindamycin (CLEOCIN) 150 MG capsule Take 3 capsules (450 mg total) by mouth 3 (three) times daily. 04/16/16  Yes Melton Krebs, PA-C  diphenhydrAMINE (BENADRYL) 25 MG tablet Take 50 mg by mouth every 6 (six) hours as needed for itching.   Yes Historical Provider, MD  erythromycin ophthalmic ointment Place a 1/2 inch ribbon of ointment into the lower eyelid. 04/16/16  Yes Melton Krebs, PA-C    ibuprofen (ADVIL,MOTRIN) 800 MG tablet Take 800 mg by mouth daily as needed for moderate pain.   Yes Historical Provider, MD  MUPIROCIN EX Apply 1 application topically 2 (two) times daily.   Yes Historical Provider, MD  silver sulfADIAZINE (SILVADENE) 1 % cream Apply topically once. 04/18/16  Yes Jerre Simon, PA  trimethoprim-polymyxin b (POLYTRIM) ophthalmic solution Place 1 drop into both eyes every 4 (four) hours. 04/18/16  Yes Jessica L Focht, PA  cephALEXin (KEFLEX) 500 MG capsule Take 1 capsule (500 mg total) by mouth 4 (four) times daily. Patient not taking: Reported on 04/19/2016 04/10/16   Charm Rings, MD  meclizine (ANTIVERT) 25 MG tablet Take 1 tablet (25 mg total) by mouth 3 (three) times daily as needed for dizziness. Patient not taking: Reported on 04/19/2016 02/09/15   Trixie Dredge, PA-C   BP 143/82 mmHg  Pulse 67  Temp(Src) 98.4 F (36.9 C) (Oral)  Resp 18  SpO2 100% Physical Exam  Constitutional: He is oriented to person, place, and time. He appears well-developed and well-nourished.  Non-toxic appearance. No distress.  HENT:  Head: Normocephalic and atraumatic.    Eyes: Conjunctivae, EOM and lids are normal. Pupils are equal, round, and reactive to light.  Neck: Normal range of motion. Neck supple. No tracheal deviation present. No thyroid mass present.  Cardiovascular: Normal rate, regular rhythm and normal heart sounds.  Exam reveals no gallop.   No murmur heard. Pulmonary/Chest: Effort normal and breath sounds normal. No stridor. No respiratory distress. He  has no decreased breath sounds. He has no wheezes. He has no rhonchi. He has no rales.  Abdominal: Soft. Normal appearance and bowel sounds are normal. He exhibits no distension. There is no tenderness. There is no rebound and no CVA tenderness.  Musculoskeletal: Normal range of motion. He exhibits no edema or tenderness.  Neurological: He is alert and oriented to person, place, and time. He has normal strength. No  cranial nerve deficit or sensory deficit. GCS eye subscore is 4. GCS verbal subscore is 5. GCS motor subscore is 6.  Skin: Skin is warm and dry. Rash noted. Rash is papular. Rash is not pustular and not urticarial.  Psychiatric: He has a normal mood and affect. His speech is normal and behavior is normal.  Nursing note and vitals reviewed.   ED Course  Procedures (including critical care time) Labs Review Labs Reviewed - No data to display  Imaging Review No results found. I have personally reviewed and evaluated these images and lab results as part of my medical decision-making.   EKG Interpretation None      MDM   Final diagnoses:  None        Patient given dose of IM Decadron, Benadryl, by mouth Pepcid. Suspect that patient is having allergic reaction. No evidence of intraoral involvement. Do not think that this represents Stevens-Johnson. Will treat with prednisone taper as well as Benadryl and Pepcid. Will have patient return here tomorrow for recheck if worse and patient to continue taking clindamycin  Lorre NickAnthony Shahana Capes, MD 04/19/16 1217  Lorre NickAnthony Christyna Letendre, MD 04/19/16 1221

## 2016-04-19 NOTE — ED Notes (Signed)
Pt with cellulits/MRSA infection to abdomen and eyes/ears.  Pt was told yesterday to come back for a recheck.

## 2016-06-19 ENCOUNTER — Encounter (HOSPITAL_COMMUNITY): Payer: Self-pay | Admitting: Emergency Medicine

## 2016-06-19 ENCOUNTER — Emergency Department (HOSPITAL_COMMUNITY)
Admission: EM | Admit: 2016-06-19 | Discharge: 2016-06-19 | Disposition: A | Discharge status: Home / Self Care | Payer: Self-pay | Attending: Emergency Medicine | Admitting: Emergency Medicine

## 2016-06-19 DIAGNOSIS — F17210 Nicotine dependence, cigarettes, uncomplicated: Secondary | ICD-10-CM | POA: Insufficient documentation

## 2016-06-19 DIAGNOSIS — Z792 Long term (current) use of antibiotics: Secondary | ICD-10-CM | POA: Insufficient documentation

## 2016-06-19 DIAGNOSIS — Z791 Long term (current) use of non-steroidal anti-inflammatories (NSAID): Secondary | ICD-10-CM | POA: Insufficient documentation

## 2016-06-19 DIAGNOSIS — J45909 Unspecified asthma, uncomplicated: Secondary | ICD-10-CM | POA: Insufficient documentation

## 2016-06-19 DIAGNOSIS — Z760 Encounter for issue of repeat prescription: Secondary | ICD-10-CM | POA: Insufficient documentation

## 2016-06-19 MED ORDER — ALBUTEROL SULFATE HFA 108 (90 BASE) MCG/ACT IN AERS
2.0000 | INHALATION_SPRAY | Freq: Once | RESPIRATORY_TRACT | Status: AC
  Administered 2016-06-19: 2 via RESPIRATORY_TRACT
  Filled 2016-06-19: qty 6.7

## 2016-06-19 MED ORDER — ALBUTEROL SULFATE HFA 108 (90 BASE) MCG/ACT IN AERS: 1.0000 | INHALATION_SPRAY | Freq: Four times a day (QID) | RESPIRATORY_TRACT | 0 refills | Status: DC | PRN

## 2016-06-19 NOTE — ED Provider Notes (Signed)
[facility]-EMERGENCY DEPT Provider Note   CSN: [id number] Arrival date & time: 06/19/16  1752   By signing my name below, I, Christel MormonMatthew Jamison, attest that this documentation has been prepared under the direction and in the presence of Everlene FarrierWilliam Eh Sesay, PA-C. Electronically Signed: Christel MormonMatthew Jamison, Scribe. 06/19/2016. 6:13 PM.   History   Chief Complaint Chief Complaint  Patient presents with  . Medication Refill     The history is provided by the patient. No language interpreter was used.    HPI Comments:  Edward Pacheco is a 25 y.o. male with PMHx of asthma who presents to the Emergency Department, here requesting a refill for his inhaler.  Pt notes that he works in [employer] and that he gets SOB when in the presence of smoke.  Pt is currently at baseline. Pt denies fevers, lightheadedness, chest tightness, chest pain, cough, hemoptysis, and current SOB.     Past Medical History:  Diagnosis Date  . Asthma     There are no active problems to display for this patient.   History reviewed. No pertinent surgical history.     Home Medications    Prior to Admission medications   Medication Sig Start Date End Date Taking? Authorizing Provider  albuterol (PROVENTIL HFA;VENTOLIN HFA) 108 (90 Base) MCG/ACT inhaler Inhale 1-2 puffs into the lungs every 6 (six) hours as needed for wheezing or shortness of breath. 06/19/16   Everlene FarrierWilliam Alize Borrayo, PA-C  cephALEXin (KEFLEX) 500 MG capsule Take 1 capsule (500 mg total) by mouth 4 (four) times daily. Patient not taking: Reported on 04/19/2016 04/10/16   Charm RingsErin J Honig, MD  clindamycin (CLEOCIN) 150 MG capsule Take 3 capsules (450 mg total) by mouth 3 (three) times daily. 04/16/16   Melton KrebsSamantha Nicole Riley, PA-C  diphenhydrAMINE (BENADRYL) 25 MG tablet Take 50 mg by mouth every 6 (six) hours as needed for itching.    Historical Provider, MD  erythromycin ophthalmic ointment Place a 1/2 inch ribbon of ointment into the lower eyelid. 04/16/16   Melton KrebsSamantha Nicole  Riley, PA-C  famotidine (PEPCID) 20 MG tablet Take 1 tablet (20 mg total) by mouth 2 (two) times daily. Use for 3-5 days until rash improves 04/19/16   Lorre NickAnthony Allen, MD  ibuprofen (ADVIL,MOTRIN) 800 MG tablet Take 800 mg by mouth daily as needed for moderate pain.    Historical Provider, MD  meclizine (ANTIVERT) 25 MG tablet Take 1 tablet (25 mg total) by mouth 3 (three) times daily as needed for dizziness. Patient not taking: Reported on 04/19/2016 02/09/15   Trixie DredgeEmily West, PA-C  MUPIROCIN EX Apply 1 application topically 2 (two) times daily.    Historical Provider, MD  predniSONE (STERAPRED UNI-PAK 21 TAB) 10 MG (21) TBPK tablet Take 1 tablet (10 mg total) by mouth daily. Take 6 tabs by mouth daily  for 2 days, then 5 tabs for 2 days, then 4 tabs for 2 days, then 3 tabs for 2 days, 2 tabs for 2 days, then 1 tab by mouth daily for 2 days 04/19/16   Lorre NickAnthony Allen, MD  silver sulfADIAZINE (SILVADENE) 1 % cream Apply topically once. 04/18/16   Jerre SimonJessica L Focht, PA  trimethoprim-polymyxin b (POLYTRIM) ophthalmic solution Place 1 drop into both eyes every 4 (four) hours. 04/18/16   Jerre SimonJessica L Focht, PA    Family History No family history on file.  Social History Social History  Substance Use Topics  . Smoking status: Light Tobacco Smoker    Types: Cigars  . Smokeless tobacco: Never Used  .  Alcohol use Yes     Comment: social      Allergies   Peanut-containing drug products; Lactose intolerance (gi); and Shellfish allergy   Review of Systems Review of Systems  Constitutional: Negative for fever.  Respiratory: Negative for cough, chest tightness and shortness of breath.   Cardiovascular: Negative for chest pain.  Skin: Negative for rash.  Neurological: Negative for syncope and light-headedness.     Physical Exam Updated Vital Signs BP 137/88 (BP Location: Left Arm)   Pulse 82   Temp 98.2 F (36.8 C) (Oral)   Resp 17   SpO2 97%   Physical Exam  Constitutional: He appears  well-developed and well-nourished. No distress.  Nontoxic appearing.  HENT:  Head: Normocephalic and atraumatic.  Right Ear: External ear normal.  Left Ear: External ear normal.  Eyes: Conjunctivae are normal. Pupils are equal, round, and reactive to light. Right eye exhibits no discharge. Left eye exhibits no discharge.  Neck: Neck supple.  Cardiovascular: Normal rate, regular rhythm, normal heart sounds and intact distal pulses.   Pulmonary/Chest: Effort normal and breath sounds normal. No respiratory distress. He has no wheezes. He has no rales.  Lungs clear to auscultation bilaterally.   Abdominal: Soft. There is no tenderness.  Lymphadenopathy:    He has no cervical adenopathy.  Neurological: He is alert. Coordination normal.  Skin: Skin is warm and dry. Capillary refill takes less than 2 seconds. No rash noted. He is not diaphoretic. No erythema. No pallor.  Psychiatric: He has a normal mood and affect. His behavior is normal.  Nursing note and vitals reviewed.    ED Treatments / Results  DIAGNOSTIC STUDIES:  Oxygen Saturation is 97% on room air, noram by my interpretation.    COORDINATION OF CARE:  6:13 PM Discussed treatment plan with pt at bedside and pt agreed to plan.  Labs (all labs ordered are listed, but only abnormal results are displayed) Labs Reviewed - No data to display  EKG  EKG Interpretation None       Radiology No results found.  Procedures Procedures (including critical care time)  Medications Ordered in ED Medications  albuterol (PROVENTIL HFA;VENTOLIN HFA) 108 (90 Base) MCG/ACT inhaler 2 puff (not administered)     Initial Impression / Assessment and Plan / ED Course  I have reviewed the triage vital signs and the nursing notes.  Pertinent labs & imaging results that were available during my care of the patient were reviewed by me and considered in my medical decision making (see chart for details).  Clinical Course   Patient  presents for refill of albuterol inhaler. He has not had one for quite a while. He reports he only seems to get short of breath around smoke. He reports currently he is feeling well. No current shortness of breath. He's had no coughing or fevers. On exam he is afebrile nontoxic appearing. Lungs clear to auscultation bilaterally. Patient provided with albuterol inhaler in the emergency department. I encouraged him to make an appointment for follow-up with a primary care provider to obtain further refills. I advised the patient to follow-up with their primary care provider this week. I advised the patient to return to the emergency department with new or worsening symptoms or new concerns. The patient verbalized understanding and agreement with plan.    I personally performed the services described in this documentation, which was scribed in my presence. The recorded information has been reviewed and is accurate.    Final Clinical Impressions(s) /  ED Diagnoses   Final diagnoses:  Medication refill  Asthma, unspecified asthma severity, uncomplicated    New Prescriptions New Prescriptions   ALBUTEROL (PROVENTIL HFA;VENTOLIN HFA) 108 (90 BASE) MCG/ACT INHALER    Inhale 1-2 puffs into the lungs every 6 (six) hours as needed for wheezing or shortness of breath.       Everlene FarrierWilliam Jazalynn Mireles, PA-C 06/19/16 1819    Derwood KaplanAnkit Nanavati, MD 06/20/16 [phone]

## 2016-06-19 NOTE — ED Triage Notes (Signed)
Patient has PMAH asthma and works in [employer] and notices that he get SOB when around smoke. Patient needing refill on inhaler.

## 2016-10-15 ENCOUNTER — Encounter (HOSPITAL_COMMUNITY): Payer: Self-pay | Admitting: Emergency Medicine

## 2016-10-15 ENCOUNTER — Emergency Department (HOSPITAL_COMMUNITY): Payer: No Typology Code available for payment source

## 2016-10-15 ENCOUNTER — Emergency Department (HOSPITAL_COMMUNITY)
Admission: EM | Admit: 2016-10-15 | Discharge: 2016-10-15 | Disposition: A | Discharge status: Home / Self Care | Payer: No Typology Code available for payment source | Attending: Emergency Medicine | Admitting: Emergency Medicine

## 2016-10-15 DIAGNOSIS — Z5321 Procedure and treatment not carried out due to patient leaving prior to being seen by health care provider: Secondary | ICD-10-CM | POA: Insufficient documentation

## 2016-10-15 DIAGNOSIS — R0602 Shortness of breath: Secondary | ICD-10-CM | POA: Diagnosis not present

## 2016-10-15 NOTE — ED Notes (Signed)
This RN notified patient was tired of waiting and is leaving.

## 2016-10-15 NOTE — ED Triage Notes (Signed)
Pt reports shortness of breath and chest pian x 3 weeks, coughing x 3 weeks. denies fever . Alert and oriented x 4.

## 2016-10-17 ENCOUNTER — Ambulatory Visit (HOSPITAL_COMMUNITY)
Admission: EM | Admit: 2016-10-17 | Discharge: 2016-10-17 | Disposition: A | Discharge status: Home / Self Care | Payer: Self-pay | Attending: Family Medicine | Admitting: Family Medicine

## 2016-10-17 DIAGNOSIS — R059 Cough, unspecified: Secondary | ICD-10-CM

## 2016-10-17 DIAGNOSIS — R05 Cough: Secondary | ICD-10-CM

## 2016-10-17 DIAGNOSIS — J40 Bronchitis, not specified as acute or chronic: Secondary | ICD-10-CM

## 2016-10-17 DIAGNOSIS — R062 Wheezing: Secondary | ICD-10-CM

## 2016-10-17 MED ORDER — AZITHROMYCIN 250 MG PO TABS: 250.0000 mg | ORAL_TABLET | Freq: Every day | ORAL | 0 refills | Status: DC

## 2016-10-17 MED ORDER — PREDNISONE 10 MG (21) PO TBPK: 10.0000 mg | ORAL_TABLET | Freq: Every day | ORAL | 0 refills | Status: DC

## 2016-10-17 MED ORDER — BENZONATATE 100 MG PO CAPS: 200.0000 mg | ORAL_CAPSULE | Freq: Three times a day (TID) | ORAL | 0 refills | Status: DC | PRN

## 2016-10-17 MED ORDER — ALBUTEROL SULFATE HFA 108 (90 BASE) MCG/ACT IN AERS: 2.0000 | INHALATION_SPRAY | RESPIRATORY_TRACT | 0 refills | Status: DC | PRN

## 2016-10-17 NOTE — ED Provider Notes (Signed)
CSN: [id number]     Arrival date & time 10/17/16  1146 History   None    Chief Complaint  Patient presents with  . URI  . Shortness of Breath   (Consider location/radiation/quality/duration/timing/severity/associated sxs/prior Treatment) Patient c/o uri and cough.  He states he has hx of asthma and feels like he is having an asthma attack.   The history is provided by the patient.  URI  Presenting symptoms: congestion and cough   Severity:  Moderate Onset quality:  Sudden Duration:  2 days Timing:  Constant Progression:  Worsening Chronicity:  New Relieved by:  Nothing Worsened by:  Nothing Ineffective treatments:  None tried Associated symptoms: wheezing   Shortness of Breath  Associated symptoms: cough and wheezing     Past Medical History:  Diagnosis Date  . Asthma    No past surgical history on file. No family history on file. Social History  Substance Use Topics  . Smoking status: Light Tobacco Smoker    Types: Cigars  . Smokeless tobacco: Never Used  . Alcohol use Yes     Comment: social     Review of Systems  Constitutional: Negative.   HENT: Positive for congestion.   Eyes: Negative.   Respiratory: Positive for cough, shortness of breath and wheezing.   Gastrointestinal: Negative.   Endocrine: Negative.   Genitourinary: Negative.   Musculoskeletal: Negative.   Skin: Negative.   Allergic/Immunologic: Negative.   Neurological: Negative.   Hematological: Negative.   Psychiatric/Behavioral: Negative.     Allergies  Peanut-containing drug products; Lactose intolerance (gi); and Shellfish allergy  Home Medications   Prior to Admission medications   Medication Sig Start Date End Date Taking? Authorizing Provider  albuterol (PROVENTIL HFA;VENTOLIN HFA) 108 (90 Base) MCG/ACT inhaler Inhale 1-2 puffs into the lungs every 6 (six) hours as needed for wheezing or shortness of breath. 06/19/16   Everlene Farrier, PA-C  albuterol (PROVENTIL HFA;VENTOLIN HFA)  108 (90 Base) MCG/ACT inhaler Inhale 2 puffs into the lungs every 4 (four) hours as needed for wheezing or shortness of breath. 10/17/16   Deatra Canter, FNP  azithromycin (ZITHROMAX) 250 MG tablet Take 1 tablet (250 mg total) by mouth daily. Take first 2 tablets together, then 1 every day until finished. 10/17/16   Deatra Canter, FNP  benzonatate (TESSALON) 100 MG capsule Take 2 capsules (200 mg total) by mouth 3 (three) times daily as needed for cough. 10/17/16   Deatra Canter, FNP  cephALEXin (KEFLEX) 500 MG capsule Take 1 capsule (500 mg total) by mouth 4 (four) times daily. Patient not taking: Reported on 04/19/2016 04/10/16   Charm Rings, MD  clindamycin (CLEOCIN) 150 MG capsule Take 3 capsules (450 mg total) by mouth 3 (three) times daily. 04/16/16   Melton Krebs, PA-C  diphenhydrAMINE (BENADRYL) 25 MG tablet Take 50 mg by mouth every 6 (six) hours as needed for itching.    Historical Provider, MD  erythromycin ophthalmic ointment Place a 1/2 inch ribbon of ointment into the lower eyelid. 04/16/16   Melton Krebs, PA-C  famotidine (PEPCID) 20 MG tablet Take 1 tablet (20 mg total) by mouth 2 (two) times daily. Use for 3-5 days until rash improves 04/19/16   Lorre Nick, MD  ibuprofen (ADVIL,MOTRIN) 800 MG tablet Take 800 mg by mouth daily as needed for moderate pain.    Historical Provider, MD  meclizine (ANTIVERT) 25 MG tablet Take 1 tablet (25 mg total) by mouth 3 (three) times daily as  needed for dizziness. Patient not taking: Reported on 04/19/2016 02/09/15   Trixie DredgeEmily West, PA-C  MUPIROCIN EX Apply 1 application topically 2 (two) times daily.    Historical Provider, MD  predniSONE (STERAPRED UNI-PAK 21 TAB) 10 MG (21) TBPK tablet Take 1 tablet (10 mg total) by mouth daily. Take 6 tabs by mouth daily  for 2 days, then 5 tabs for 2 days, then 4 tabs for 2 days, then 3 tabs for 2 days, 2 tabs for 2 days, then 1 tab by mouth daily for 2 days 04/19/16   Lorre NickAnthony Allen, MD   predniSONE (STERAPRED UNI-PAK 21 TAB) 10 MG (21) TBPK tablet Take 1 tablet (10 mg total) by mouth daily. Take 6-5-4-3-2-1 po qd 10/17/16   Deatra CanterWilliam J Oxford, FNP  silver sulfADIAZINE (SILVADENE) 1 % cream Apply topically once. 04/18/16   Jerre SimonJessica L Focht, PA  trimethoprim-polymyxin b (POLYTRIM) ophthalmic solution Place 1 drop into both eyes every 4 (four) hours. 04/18/16   Jerre SimonJessica L Focht, PA   Meds Ordered and Administered this Visit  Medications - No data to display  BP 134/82 (BP Location: Right Arm)   Pulse 75   Temp 98 F (36.7 C) (Oral)   Resp 16   SpO2 98%  No data found.   Physical Exam  Constitutional: He is oriented to person, place, and time. He appears well-developed and well-nourished.  HENT:  Head: Normocephalic and atraumatic.  Right Ear: External ear normal.  Left Ear: External ear normal.  Mouth/Throat: Oropharynx is clear and moist.  Eyes: Conjunctivae and EOM are normal. Pupils are equal, round, and reactive to light.  Neck: Normal range of motion. Neck supple.  Cardiovascular: Normal rate, regular rhythm and normal heart sounds.   Pulmonary/Chest: Effort normal. He has wheezes.  Abdominal: Soft. Bowel sounds are normal.  Neurological: He is alert and oriented to person, place, and time.  Nursing note and vitals reviewed.   Urgent Care Course   Clinical Course     Procedures (including critical care time)  Labs Review Labs Reviewed - No data to display  Imaging Review Dg Chest 2 View  Result Date: 10/15/2016 CLINICAL DATA:  Initial evaluation for acute shortness of breath, congestion, productive cough. History of asthma. EXAM: CHEST  2 VIEW COMPARISON:  Prior radiograph from 02/06/2015. FINDINGS: Cardiac and mediastinal silhouettes are within normal limits. Lungs are mildly hyperinflated. Scattered peribronchial thickening, suspected to related to history of asthma. No focal infiltrates. No pulmonary edema or pleural effusion. No pneumothorax. No acute  osseous abnormality. IMPRESSION: 1. Mild scattered peribronchial thickening, suspected to be related to history of asthma/reactive airways disease. 2. No other active cardiopulmonary disease identified. Electronically Signed   By: Rise MuBenjamin  McClintock M.D.   On: 10/15/2016 18:35     Visual Acuity Review  Right Eye Distance:   Left Eye Distance:   Bilateral Distance:    Right Eye Near:   Left Eye Near:    Bilateral Near:         MDM   1. Bronchitis   2. Cough   3. Wheezing    Zithromax 250mg  2 po first day and then one po qd x 4 days #6 Prednisone 10mg  6-5-4-3-2-1- po qd #21 Tessalon Perles 200mg  one po tid prn #30 Albuterol MDI 2 puffs q 4 -6 hours prn   Push po fluids, rest, tylenol and motrin otc prn as directed for fever, arthralgias, and myalgias.  Follow up prn if sx's continue or persist.    Chrissie NoaWilliam  Sabino GasserJ Oxford, FNP 10/17/16 1317

## 2016-10-17 NOTE — ED Triage Notes (Signed)
C/o cold sx  States he is having some sob  Was dx with asthma as a kid

## 2017-07-30 ENCOUNTER — Emergency Department (HOSPITAL_BASED_OUTPATIENT_CLINIC_OR_DEPARTMENT_OTHER)
Admission: EM | Admit: 2017-07-30 | Discharge: 2017-07-31 | Disposition: A | Discharge status: Home / Self Care | Payer: Managed Care, Other (non HMO) | Attending: Emergency Medicine | Admitting: Emergency Medicine

## 2017-07-30 ENCOUNTER — Encounter (HOSPITAL_BASED_OUTPATIENT_CLINIC_OR_DEPARTMENT_OTHER): Payer: Self-pay | Admitting: Emergency Medicine

## 2017-07-30 ENCOUNTER — Emergency Department (HOSPITAL_BASED_OUTPATIENT_CLINIC_OR_DEPARTMENT_OTHER): Payer: Managed Care, Other (non HMO)

## 2017-07-30 DIAGNOSIS — J45909 Unspecified asthma, uncomplicated: Secondary | ICD-10-CM | POA: Insufficient documentation

## 2017-07-30 DIAGNOSIS — Z760 Encounter for issue of repeat prescription: Secondary | ICD-10-CM | POA: Diagnosis not present

## 2017-07-30 DIAGNOSIS — Z91010 Allergy to peanuts: Secondary | ICD-10-CM | POA: Insufficient documentation

## 2017-07-30 DIAGNOSIS — F17290 Nicotine dependence, other tobacco product, uncomplicated: Secondary | ICD-10-CM | POA: Insufficient documentation

## 2017-07-30 DIAGNOSIS — R002 Palpitations: Secondary | ICD-10-CM | POA: Diagnosis present

## 2017-07-30 DIAGNOSIS — T50905A Adverse effect of unspecified drugs, medicaments and biological substances, initial encounter: Secondary | ICD-10-CM

## 2017-07-30 NOTE — ED Triage Notes (Signed)
Patient states that he has had SOB x 2 -3 months and reports that he thought his "asthma was coming back" - patient reports that today he was at the movie theater and something scaring came on the screena nd he started to have increased HR and then felt like his heart stopped. He had tingling to his feet and hands. Patient reports intermittent palpitations at this time

## 2017-07-31 ENCOUNTER — Encounter (HOSPITAL_BASED_OUTPATIENT_CLINIC_OR_DEPARTMENT_OTHER): Payer: Self-pay | Admitting: Emergency Medicine

## 2017-07-31 MED ORDER — ALBUTEROL SULFATE HFA 108 (90 BASE) MCG/ACT IN AERS: 1.0000 | INHALATION_SPRAY | Freq: Four times a day (QID) | RESPIRATORY_TRACT | 0 refills | Status: AC | PRN

## 2017-07-31 NOTE — ED Notes (Addendum)
Alert, NAD, calm, interactive, resps e/u, speaking in clear complete sentences, no dyspnea noted, skin W&D, VSS, reports earlier sx of palpitations, sob, dizzy, bloated feeling (resolved),  (denies: pain, sob, nausea, dizziness, visual changes or other sx). Family at [facility].

## 2017-07-31 NOTE — ED Notes (Addendum)
Pt requesting an inhaler at d/c. Has a history of asthma. Denies any wheezing or chest tightness. MD updated and RX given. Extensive d/c education provided to pt. Voiced understanding.

## 2017-07-31 NOTE — ED Provider Notes (Signed)
[facility] Provider Note   CSN: [id number] Arrival date & time: 07/30/17  2148     History   Chief Complaint Chief Complaint  Patient presents with  . Palpitations    HPI Edward Pacheco is a 26 y.o. male.  The history is provided by the patient.  Palpitations   This is a new problem. The current episode started 3 to 5 hours ago. The problem occurs constantly. The problem has not changed since onset.The problem is associated with fear (marijuana). Pertinent negatives include no diaphoresis, no fever, no malaise/fatigue, no numbness, no chest pain, no chest pressure, no claudication, no exertional chest pressure, no irregular heartbeat, no near-syncope, no orthopnea, no PND, no syncope, no abdominal pain, no nausea, no vomiting, no headaches, no back pain, no leg pain, no lower extremity edema, no dizziness, no weakness, no cough, no hemoptysis, no shortness of breath and no sputum production. He has tried nothing for the symptoms. The treatment provided moderate relief. There are no known risk factors. His past medical history does not include hyperthyroidism.    Past Medical History:  Diagnosis Date  . Asthma     There are no active problems to display for this patient.   History reviewed. No pertinent surgical history.     Home Medications    Prior to Admission medications   Medication Sig Start Date End Date Taking? Authorizing Provider  albuterol (PROVENTIL HFA;VENTOLIN HFA) 108 (90 Base) MCG/ACT inhaler Inhale 1-2 puffs into the lungs every 6 (six) hours as needed for wheezing or shortness of breath. 07/31/17   Celise Bazar, MD  azithromycin (ZITHROMAX) 250 MG tablet Take 1 tablet (250 mg total) by mouth daily. Take first 2 tablets together, then 1 every day until finished. 10/17/16   Deatra Canter, FNP  benzonatate (TESSALON) 100 MG capsule Take 2 capsules (200 mg total) by mouth 3 (three) times daily as needed for cough. 10/17/16   Deatra Canter,  FNP  cephALEXin (KEFLEX) 500 MG capsule Take 1 capsule (500 mg total) by mouth 4 (four) times daily. Patient not taking: Reported on 04/19/2016 04/10/16   Charm Rings, MD  clindamycin (CLEOCIN) 150 MG capsule Take 3 capsules (450 mg total) by mouth 3 (three) times daily. 04/16/16   Melton Krebs, PA-C  diphenhydrAMINE (BENADRYL) 25 MG tablet Take 50 mg by mouth every 6 (six) hours as needed for itching.    [provider]  erythromycin ophthalmic ointment Place a 1/2 inch ribbon of ointment into the lower eyelid. 04/16/16   Melton Krebs, PA-C  famotidine (PEPCID) 20 MG tablet Take 1 tablet (20 mg total) by mouth 2 (two) times daily. Use for 3-5 days until rash improves 04/19/16   Lorre Nick, MD  ibuprofen (ADVIL,MOTRIN) 800 MG tablet Take 800 mg by mouth daily as needed for moderate pain.    [provider]  meclizine (ANTIVERT) 25 MG tablet Take 1 tablet (25 mg total) by mouth 3 (three) times daily as needed for dizziness. Patient not taking: Reported on 04/19/2016 02/09/15   Trixie Dredge, PA-C  MUPIROCIN EX Apply 1 application topically 2 (two) times daily.    [provider]  predniSONE (STERAPRED UNI-PAK 21 TAB) 10 MG (21) TBPK tablet Take 1 tablet (10 mg total) by mouth daily. Take 6 tabs by mouth daily  for 2 days, then 5 tabs for 2 days, then 4 tabs for 2 days, then 3 tabs for 2 days, 2 tabs for 2 days, then  1 tab by mouth daily for 2 days 04/19/16   Lorre Nick, MD  predniSONE (STERAPRED UNI-PAK 21 TAB) 10 MG (21) TBPK tablet Take 1 tablet (10 mg total) by mouth daily. Take 6-5-4-3-2-1 po qd 10/17/16   Deatra Canter, FNP  silver sulfADIAZINE (SILVADENE) 1 % cream Apply topically once. 04/18/16   Focht, Joyce Copa, PA  trimethoprim-polymyxin b (POLYTRIM) ophthalmic solution Place 1 drop into both eyes every 4 (four) hours. 04/18/16   Jerre Simon, PA    Family History History reviewed. No pertinent family history.  Social History Social  History  Substance Use Topics  . Smoking status: Light Tobacco Smoker    Types: Cigars  . Smokeless tobacco: Never Used  . Alcohol use Yes     Comment: social      Allergies   Peanut-containing drug products; Lactose intolerance (gi); and Shellfish allergy   Review of Systems Review of Systems  Constitutional: Negative for diaphoresis, fever and malaise/fatigue.  Respiratory: Negative for cough, hemoptysis, sputum production and shortness of breath.   Cardiovascular: Positive for palpitations. Negative for chest pain, orthopnea, claudication, syncope, PND and near-syncope.  Gastrointestinal: Negative for abdominal pain, nausea and vomiting.  Musculoskeletal: Negative for back pain.  Neurological: Negative for dizziness, weakness, numbness and headaches.  All other systems reviewed and are negative.    Physical Exam Updated Vital Signs BP (!) 146/98   Pulse 70   Temp 98.8 F (37.1 C) (Oral)   Resp 12   Ht  (1.702 m)   Wt 82.6 kg (182 lb)   SpO2 100%   BMI 28.51 kg/m   Physical Exam  Constitutional: He is oriented to person, place, and time. He appears well-developed and well-nourished. No distress.  HENT:  Head: Normocephalic and atraumatic.  Mouth/Throat: No oropharyngeal exudate.  Eyes: Pupils are equal, round, and reactive to light. Conjunctivae are normal.  Neck: Normal range of motion. Neck supple.  Cardiovascular: Normal rate, regular rhythm, normal heart sounds and intact distal pulses.   Pulmonary/Chest: Effort normal and breath sounds normal. He has no wheezes. He has no rales.  Abdominal: Soft. Bowel sounds are normal. He exhibits no mass. There is no tenderness. There is no rebound and no guarding.  Musculoskeletal: Normal range of motion.  Neurological: He is alert and oriented to person, place, and time. He displays normal reflexes.  Skin: Skin is warm and dry. Capillary refill takes less than 2 seconds.  Psychiatric: He has a normal mood and  affect.     ED Treatments / Results   Vitals:   07/31/17 0122 07/31/17 0130  BP: 130/81 (!) 146/98  Pulse: 73 70  Resp: 20 12  Temp:    SpO2: 100% 100%    EKG  EKG Interpretation  Date/Time:  Sunday July 30 2017 22:05:53 EDT Ventricular Rate:  85 PR Interval:  134 QRS Duration: 90 QT Interval:  366 QTC Calculation: 435 R Axis:   79 Text Interpretation:  Normal sinus rhythm with sinus arrhythmia Confirmed by Mila Pair ([id number]) on 07/30/2017 11:09:41 PM       Radiology Dg Chest 2 View  Result Date: 07/30/2017 CLINICAL DATA:  Shortness of breath for 3 months. Asthma. Palpitations. Tingling to the hands and feet. EXAM: CHEST  2 VIEW COMPARISON:  10/15/2016 FINDINGS: The heart size and mediastinal contours are within normal limits. Both lungs are clear. The visualized skeletal structures are unremarkable. IMPRESSION: No active cardiopulmonary disease. Electronically Signed   By: Marisa Cyphers.D.  On: 07/30/2017 22:49    Procedures Procedures (including critical care time)  Medications Ordered in ED Medications - No data to display     Final Clinical Impressions(s) / ED Diagnoses   Final diagnoses:  Adverse drug effect, initial encounter  Palpitations  Medication refill   PERC negative wells 0 highyl doubt PE.  No ectopy on the monitor.  Patient informed that one can have this reaction for marijuana.  Instructed to stop. FOllow up with your PMD for recheck.    Shortness of breath, swelling or the lips or tongue, chest pain, dyspnea on exertion, new weakness or numbness changes in vision or speech,  Inability to tolerate liquids or food, changes in voice cough, altered mental status or any concerns. No signs of systemic illness or infection. The patient is nontoxic-appearing on exam and vital signs are within normal limits.    I have reviewed the triage vital signs and the nursing notes. Pertinent labs &imaging results that were available during my  care of the patient were reviewed by me and considered in my medical decision making (see chart for details).  After history, exam, and medical workup I feel the patient has been appropriately medically screened and is safe for discharge home. Pertinent diagnoses were discussed with the patient. Patient was given return precautions.     New Prescriptions Discharge Medication List as of 07/31/2017  2:13 AM       Tanajah Boulter, MD 07/31/17 1610

## 2017-07-31 NOTE — ED Notes (Signed)
Dr. Palumbo at BS 

## 2017-12-17 ENCOUNTER — Emergency Department (HOSPITAL_COMMUNITY)
Admission: EM | Admit: 2017-12-17 | Discharge: 2017-12-18 | Disposition: A | Discharge status: Home / Self Care | Payer: Self-pay | Attending: Emergency Medicine | Admitting: Emergency Medicine

## 2017-12-17 ENCOUNTER — Other Ambulatory Visit: Payer: Self-pay

## 2017-12-17 ENCOUNTER — Emergency Department (HOSPITAL_COMMUNITY): Payer: Self-pay

## 2017-12-17 ENCOUNTER — Encounter (HOSPITAL_COMMUNITY): Payer: Self-pay

## 2017-12-17 DIAGNOSIS — F17290 Nicotine dependence, other tobacco product, uncomplicated: Secondary | ICD-10-CM | POA: Insufficient documentation

## 2017-12-17 DIAGNOSIS — J45909 Unspecified asthma, uncomplicated: Secondary | ICD-10-CM | POA: Insufficient documentation

## 2017-12-17 DIAGNOSIS — Z79899 Other long term (current) drug therapy: Secondary | ICD-10-CM | POA: Insufficient documentation

## 2017-12-17 DIAGNOSIS — R079 Chest pain, unspecified: Secondary | ICD-10-CM | POA: Insufficient documentation

## 2017-12-17 DIAGNOSIS — R0602 Shortness of breath: Secondary | ICD-10-CM | POA: Insufficient documentation

## 2017-12-17 LAB — I-STAT TROPONIN, ED: TROPONIN I, POC: 0 ng/mL (ref 0.00–0.08)

## 2017-12-17 LAB — BASIC METABOLIC PANEL
Anion gap: 11 (ref 5–15)
BUN: 11 mg/dL (ref 6–20)
CALCIUM: 10.1 mg/dL (ref 8.9–10.3)
CO2: 25 mmol/L (ref 22–32)
Chloride: 104 mmol/L (ref 101–111)
Creatinine, Ser: 1.14 mg/dL (ref 0.61–1.24)
GFR calc Af Amer: >60 mL/min (ref >60)
GFR calc non Af Amer: >60 mL/min (ref >60)
GLUCOSE: 90 mg/dL (ref 65–99)
Potassium: 4 mmol/L (ref 3.5–5.1)
Sodium: 140 mmol/L (ref 135–145)

## 2017-12-17 LAB — CBC
HCT: 45.6 % (ref 39.0–52.0)
Hemoglobin: 15.9 g/dL (ref 13.0–17.0)
MCH: 31.9 pg (ref 26.0–34.0)
MCHC: 34.9 g/dL (ref 30.0–36.0)
MCV: 91.4 fL (ref 78.0–100.0)
PLATELETS: 314 10*3/uL (ref 150–400)
RBC: 4.99 MIL/uL (ref 4.22–5.81)
RDW: 13.2 % (ref 11.5–15.5)
WBC: 6.1 10*3/uL (ref 4.0–10.5)

## 2017-12-17 NOTE — ED Notes (Signed)
Patient transported to X-ray 

## 2017-12-17 NOTE — ED Notes (Signed)
Lab draw attempt x 1 unsuccessful.

## 2017-12-17 NOTE — ED Triage Notes (Signed)
He c/o central chest area pain, plus shortness of breath x 2 weeks. He is in no distress. Early repolarization, otherwise negative EKG performed at triage.

## 2017-12-18 LAB — I-STAT TROPONIN, ED: Troponin i, poc: 0 ng/mL (ref 0.00–0.08)

## 2017-12-18 LAB — D-DIMER, QUANTITATIVE: D-Dimer, Quant: <0.27 ug/mL-FEU (ref 0.00–0.50)

## 2017-12-18 MED ORDER — GI COCKTAIL ~~LOC~~
30.0000 mL | Freq: Once | ORAL | Status: AC
  Administered 2017-12-18: 30 mL via ORAL
  Filled 2017-12-18: qty 30

## 2017-12-18 NOTE — ED Notes (Signed)
Patient refused vital signs stating he would like to go. Computer restarting, unable to get patient to sign.

## 2017-12-18 NOTE — ED Provider Notes (Signed)
Assumed care from PA Layden at shift change.  See prior notes for full H&P.  Briefly, 27 year old male presenting to the ED with chest pain for the past 2 weeks.  No known alleviating or exacerbating factors.  Pain does seem to move around.  Has had some palpitations with exercising but denies any chest pain.  No history of DVT or PE.  Workup thus far reassuring including troponin x2.  Plan: D-dimer pending.  If negative can likely discharge.  Results for orders placed or performed during the hospital encounter of 12/17/17  Basic metabolic panel  Result Value Ref Range   Sodium 140 135 - 145 mmol/L   Potassium 4.0 3.5 - 5.1 mmol/L   Chloride 104 101 - 111 mmol/L   CO2 25 22 - 32 mmol/L   Glucose, Bld 90 65 - 99 mg/dL   BUN 11 6 - 20 mg/dL   Creatinine, Ser 4.541.14 0.61 - 1.24 mg/dL   Calcium 09.810.1 8.9 - 11.910.3 mg/dL   GFR calc non Af Amer >60 >60 mL/min   GFR calc Af Amer >60 >60 mL/min   Anion gap 11 5 - 15  CBC  Result Value Ref Range   WBC 6.1 4.0 - 10.5 K/uL   RBC 4.99 4.22 - 5.81 MIL/uL   Hemoglobin 15.9 13.0 - 17.0 g/dL   HCT 14.745.6 82.939.0 - 56.252.0 %   MCV 91.4 78.0 - 100.0 fL   MCH 31.9 26.0 - 34.0 pg   MCHC 34.9 30.0 - 36.0 g/dL   RDW 13.013.2 86.511.5 - 78.415.5 %   Platelets 314 150 - 400 K/uL  D-dimer, quantitative (not at [facility])  Result Value Ref Range   D-Dimer, Quant <0.27 0.00 - 0.50 ug/mL-FEU  I-stat troponin, ED  Result Value Ref Range   Troponin i, poc 0.00 0.00 - 0.08 ng/mL   Comment 3          [facility]-Stat Troponin, ED [facility])  Result Value Ref Range   Troponin i, poc 0.00 0.00 - 0.08 ng/mL   Comment 3           Dg Chest 2 View  Result Date: 12/17/2017 CLINICAL DATA:  Shortness of breath.  Chest pain. EXAM: CHEST  2 VIEW COMPARISON:  None. FINDINGS: The heart size and mediastinal contours are within normal limits. Both lungs are clear. The visualized skeletal structures are unremarkable. IMPRESSION: No active cardiopulmonary disease. Electronically Signed   By: Signa Kellaylor  Stroud  M.D.   On: 12/17/2017 16:14   3:04 AM D-dimer negative. Patient cardiac evaluation here has been reassuring.  Patient is ready to go home.  Stable for discharge.  Will have him follow-up closely with PCP.   Garlon HatchetSanders, Aidan Caloca M, PA-C 12/18/17 0421    Dione BoozeGlick, David, MD 12/18/17 [phone]

## 2017-12-18 NOTE — Discharge Instructions (Signed)
You can take Tylenol or Ibuprofen as directed for pain. You can alternate Tylenol and Ibuprofen every 4 hours. If you take Tylenol at 1pm, then you can take Ibuprofen at 5pm. Then you can take Tylenol again at 9pm.   [facility].   Follow-up with the referred cardiology office if you continue to have pain..  Return to the Emergency Department immediately if you experiencing worsening chest pain, difficulty breathing, nausea/vomiting, get very sweaty, headache or any other worsening or concerning symptoms.

## 2017-12-18 NOTE — ED Provider Notes (Signed)
[facility]-EMERGENCY DEPT Provider Note   CSN: [id number] Arrival date & time: 12/17/17  1505     History   Chief Complaint Chief Complaint  Patient presents with  . Chest Pain  . Shortness of Breath    HPI Edward Pacheco is a 27 y.o. male with PMH/o Asthma who presents for evaluation of intermittent chest pain that is been ongoing for the last 2 weeks.  Patient reports that pain is intermittent and does not know what triggers pain.  He states that pain is not confined to one location and states that it is at different places in his chest. He describes it as a tightness.  He states that it is not worse with deep inspiration.  He does not remember if it is worse with exertion but he states that he has been able to do his normal activities without difficulty.  He does state that when he went to go work out at [employer], he felt like it is heart was racing very fast but denied any pain at that time.  He has not had any associated nausea, diaphoresis.  He felt like occasionally he will have some shortness of breath that he feels is worse with exertion.  He states that the shortness of breath will resolve on its own without any difficulty.  Patient reports that he will intermittently take aspirin for pain which he states has been helping. Patient denies any personal cardiac history. He denies any family cardiac history.  Patient does not smoke.  He denies any cocaine, heroin, marijuana use.  Patient denies any alcohol use. He denies any steroid, recent immobilization, prior history of DVT/PE, recent surgery, leg swelling, or long travel.   The history is provided by the patient.    Past Medical History:  Diagnosis Date  . Asthma     There are no active problems to display for this patient.   No past surgical history on file.     Home Medications    Prior to Admission medications   Medication Sig Start Date End Date Taking? Authorizing Provider  albuterol  (PROVENTIL HFA;VENTOLIN HFA) 108 (90 Base) MCG/ACT inhaler Inhale 1-2 puffs into the lungs every 6 (six) hours as needed for wheezing or shortness of breath. 07/31/17  Yes Palumbo, April, MD  clobetasol (TEMOVATE) 0.05 % external solution Apply 1 application topically 2 (two) times daily.   Yes [provider]  doxycycline (VIBRAMYCIN) 100 MG capsule Take 100 mg by mouth 2 (two) times daily. 12/12/17  Yes [provider]  erythromycin ophthalmic ointment Place a 1/2 inch ribbon of ointment into the lower eyelid. Patient not taking: Reported on 12/18/2017 04/16/16   Melton Krebs, PA-C  famotidine (PEPCID) 20 MG tablet Take 1 tablet (20 mg total) by mouth 2 (two) times daily. Use for 3-5 days until rash improves Patient not taking: Reported on 12/18/2017 04/19/16   Lorre Nick, MD  silver sulfADIAZINE (SILVADENE) 1 % cream Apply topically once. Patient not taking: Reported on 12/18/2017 04/18/16   Jerre Simon, PA  trimethoprim-polymyxin b (POLYTRIM) ophthalmic solution Place 1 drop into both eyes every 4 (four) hours. Patient not taking: Reported on 12/18/2017 04/18/16   Jerre Simon, PA    Family History No family history on file.  Social History Social History   Tobacco Use  . Smoking status: Light Tobacco Smoker    Types: Cigars  . Smokeless tobacco: Never Used  Substance Use Topics  . Alcohol use: Yes  Comment: social   . Drug use: No     Allergies   Peanut-containing drug products; Lactose intolerance (gi); and Shellfish allergy   Review of Systems Review of Systems  Constitutional: Negative for chills and fever.  Eyes: Negative for visual disturbance.  Respiratory: Positive for shortness of breath. Negative for cough.   Cardiovascular: Positive for chest pain. Negative for palpitations.  Gastrointestinal: Negative for abdominal pain, diarrhea, nausea and vomiting.  Genitourinary: Negative for dysuria and hematuria.  Musculoskeletal:  Negative for back pain and neck pain.  Skin: Negative for rash.  Neurological: Negative for dizziness, weakness, numbness and headaches.  All other systems reviewed and are negative.    Physical Exam Updated Vital Signs BP (!) 141/86 (BP Location: Left Arm)   Pulse 69   Temp 99.4 F (37.4 C) (Oral)   Resp 19   SpO2 100%   Physical Exam  Constitutional: He is oriented to person, place, and time. He appears well-developed and well-nourished.  HENT:  Head: Normocephalic and atraumatic.  Mouth/Throat: Oropharynx is clear and moist and mucous membranes are normal.  Eyes: Conjunctivae, EOM and lids are normal. Pupils are equal, round, and reactive to light.  Neck: Full passive range of motion without pain.  Cardiovascular: Normal rate, regular rhythm, normal heart sounds and normal pulses. Exam reveals no gallop and no friction rub.  No murmur heard. Pulses:      Radial pulses are 2+ on the right side, and 2+ on the left side.  Pulmonary/Chest: Effort normal and breath sounds normal.  No evidence of respiratory distress. Able to speak in full sentences without difficulty. No tenderness to palpation to anterior chest.   Abdominal: Soft. Normal appearance. There is no tenderness. There is no rigidity and no guarding.  Musculoskeletal: Normal range of motion.  Bilateral extremities are symmetric in appearance  Neurological: He is alert and oriented to person, place, and time.  Skin: Skin is warm and dry. Capillary refill takes less than 2 seconds.  Psychiatric: He has a normal mood and affect. His speech is normal.  Nursing note and vitals reviewed.    ED Treatments / Results  Labs (all labs ordered are listed, but only abnormal results are displayed) Labs Reviewed  BASIC METABOLIC PANEL  CBC  D-DIMER, QUANTITATIVE (NOT AT [facility])  I-STAT TROPONIN, ED  I-STAT TROPONIN, ED    EKG  EKG Interpretation  Date/Time:  Sunday December 17 2017 15:36:06 EST Ventricular Rate:  66 PR  Interval:    QRS Duration: 83 QT Interval:  369 QTC Calculation: 387 R Axis:   73 Text Interpretation:  Sinus rhythm ST elev, probable normal early repol pattern No significant change since last tracing Confirmed by Shaune Pollack [phone]) on 12/17/2017 3:38:57 PM       Radiology Dg Chest 2 View  Result Date: 12/17/2017 CLINICAL DATA:  Shortness of breath.  Chest pain. EXAM: CHEST  2 VIEW COMPARISON:  None. FINDINGS: The heart size and mediastinal contours are within normal limits. Both lungs are clear. The visualized skeletal structures are unremarkable. IMPRESSION: No active cardiopulmonary disease. Electronically Signed   By: Signa Kell M.D.   On: 12/17/2017 16:14    Procedures Procedures (including critical care time)  Medications Ordered in ED Medications  gi cocktail (Maalox,Lidocaine,Donnatal) (30 mLs Oral Given 12/18/17 0136)     Initial Impression / Assessment and Plan / ED Course  I have reviewed the triage vital signs and the nursing notes.  Pertinent labs & imaging results that were  available during my care of the patient were reviewed by me and considered in my medical decision making (see chart for details).     27 y.o. M with PMH/o Asthma who presents for evaluation of chest pain that is been ongoing for the last 2 weeks.  Patient reports that chest pain varies in location.  He states that it is not worse with deep inspiration.  He has not noticed it is worse with exertion but states he has been able to carry on his normal activity without any difficulty.  He did report one episode where he was exercising at the gym where he felt like his heart was beating faster but states he did not have pain at that time.  He has been taking aspirin for relief of symptoms which he states has been helping.  Also with some intermittent shortness of breath that is worse with exertion. No cardiac risk factors.  Personal cardiac history.  Consider anxiety versus acute infectious  etiology versus ACS etiology vs GERD.  Low suspicion for PE but will plan d-dimer for further evaluation.  Initial labs ordered at triage.  Initial troponin is negative.  BMP is without any acute abnormality.  CBC is without any acute abnormality.  Acute infectious etiology.  EKG shows slight early repolarization but otherwise no acute abnormalities.  It does appear that he has had these early repolarization abnormalities in previous EKGs.  Given patient's history/physical and history, he has a heart score of 1.  We will plan to repeat troponin.   Delta Troponin is negative.  D-dimer is pending.  I discussed results with patient.  He reports that after talking with RN, he states that he has had some burning pain.  We will plan to give GI cocktail in the department.  I discussed with patient regarding his risk factors.  He currently has no cardiac risk factors, including no personal cardiac history, no family cardiac history.  At this time, I feel that with a negative delta troponin, reassuring EKG, he can be discharged home safely.  We will plan to have a follow-up with outpatient [facility] and give him outpatient cardiology referral for further evaluation of symptoms.  Patient signed out to Sharilyn SitesLisa Sanders, PA-C with D-Dimer Pending. Please see her note for further evaluation.     Final Clinical Impressions(s) / ED Diagnoses   Final diagnoses:  Chest pain, unspecified type    ED Discharge Orders    None       Rosana HoesLayden, Carloyn Lahue A, PA-C 12/18/17 0310    Dione BoozeGlick, David, MD 12/18/17 [phone]

## 2018-03-19 ENCOUNTER — Emergency Department (HOSPITAL_COMMUNITY)
Admission: EM | Admit: 2018-03-19 | Discharge: 2018-03-19 | Disposition: A | Discharge status: Home / Self Care | Payer: No Typology Code available for payment source | Attending: Emergency Medicine | Admitting: Emergency Medicine

## 2018-03-19 ENCOUNTER — Encounter (HOSPITAL_COMMUNITY): Payer: Self-pay | Admitting: *Deleted

## 2018-03-19 DIAGNOSIS — Y939 Activity, unspecified: Secondary | ICD-10-CM | POA: Insufficient documentation

## 2018-03-19 DIAGNOSIS — S199XXA Unspecified injury of neck, initial encounter: Secondary | ICD-10-CM | POA: Diagnosis present

## 2018-03-19 DIAGNOSIS — Y999 Unspecified external cause status: Secondary | ICD-10-CM | POA: Diagnosis not present

## 2018-03-19 DIAGNOSIS — M62838 Other muscle spasm: Secondary | ICD-10-CM

## 2018-03-19 DIAGNOSIS — Z79899 Other long term (current) drug therapy: Secondary | ICD-10-CM | POA: Insufficient documentation

## 2018-03-19 DIAGNOSIS — F17210 Nicotine dependence, cigarettes, uncomplicated: Secondary | ICD-10-CM | POA: Insufficient documentation

## 2018-03-19 DIAGNOSIS — Y92410 Unspecified street and highway as the place of occurrence of the external cause: Secondary | ICD-10-CM | POA: Diagnosis not present

## 2018-03-19 DIAGNOSIS — T148XXA Other injury of unspecified body region, initial encounter: Secondary | ICD-10-CM

## 2018-03-19 DIAGNOSIS — J45909 Unspecified asthma, uncomplicated: Secondary | ICD-10-CM | POA: Diagnosis not present

## 2018-03-19 DIAGNOSIS — M62830 Muscle spasm of back: Secondary | ICD-10-CM | POA: Insufficient documentation

## 2018-03-19 DIAGNOSIS — S161XXA Strain of muscle, fascia and tendon at neck level, initial encounter: Secondary | ICD-10-CM | POA: Diagnosis not present

## 2018-03-19 MED ORDER — CYCLOBENZAPRINE HCL 10 MG PO TABS
5.0000 mg | ORAL_TABLET | Freq: Every day | ORAL | 0 refills | Status: AC
Start: 2018-03-19 — End: 2018-03-29

## 2018-03-19 MED ORDER — IBUPROFEN 800 MG PO TABS
800.0000 mg | ORAL_TABLET | Freq: Once | ORAL | Status: AC
  Administered 2018-03-19: 800 mg via ORAL
  Filled 2018-03-19: qty 1

## 2018-03-19 NOTE — Discharge Instructions (Addendum)
You may use over-the-counter Motrin (Ibuprofen), Acetaminophen (Tylenol), topical muscle creams such as SalonPas, Icy Hot, Bengay, etc. Please stretch, apply heat, and have massage therapy for additional assistance. ° °
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
## 2018-03-19 NOTE — ED Notes (Signed)
Bed: [facility] Expected date:  Expected time:  Means of arrival:  Comments: 
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
## 2018-03-19 NOTE — ED Notes (Signed)
Patient has already had [employer] before arriving to the emergency department.

## 2019-01-25 IMAGING — CR DG CHEST 2V
2 series · 2 of 2 positions shown · non-contrast
Comparison: None.

CLINICAL DATA: Shortness of breath.  Chest pain.

EXAM:
CHEST  2 VIEW

[w chest pa]
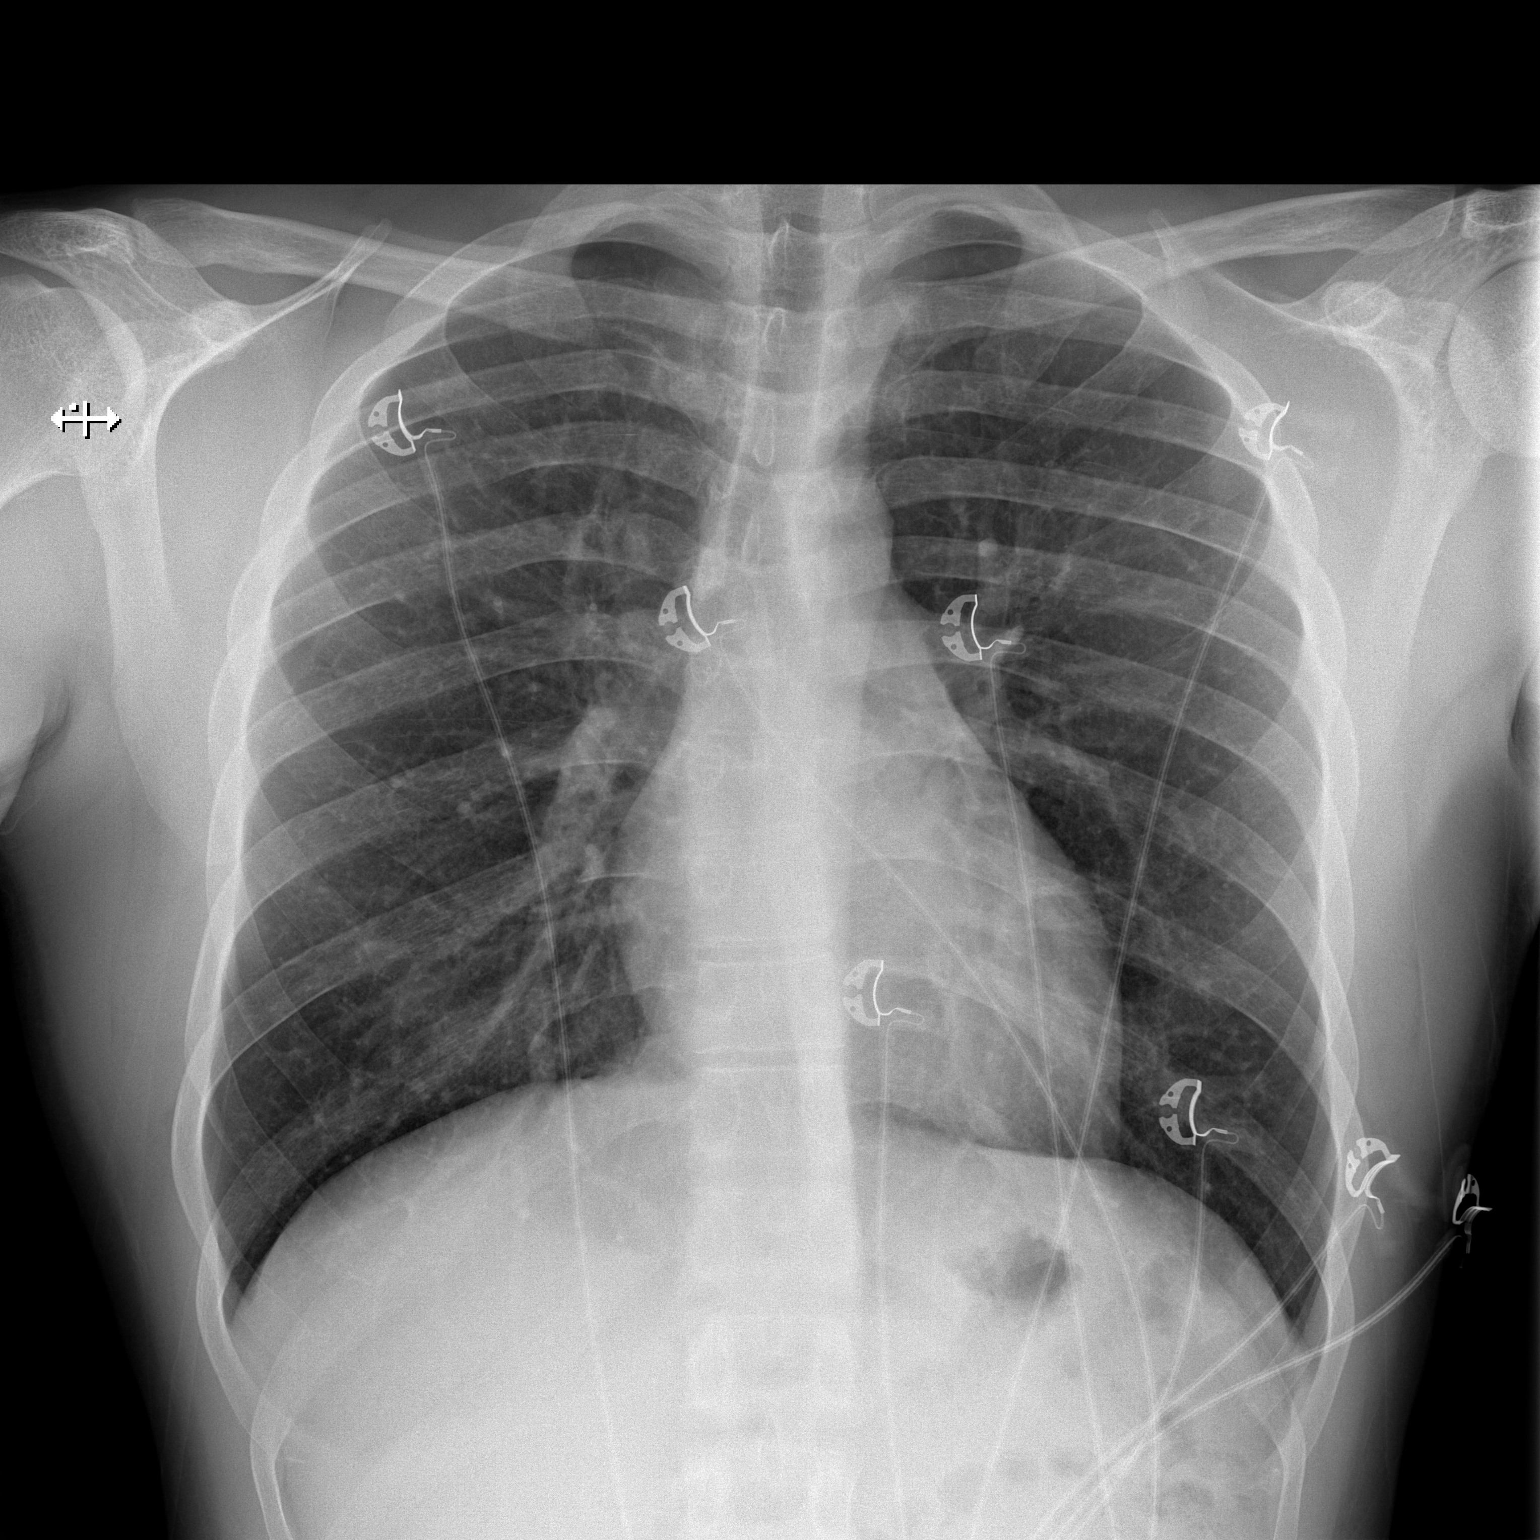

[w chest lat]
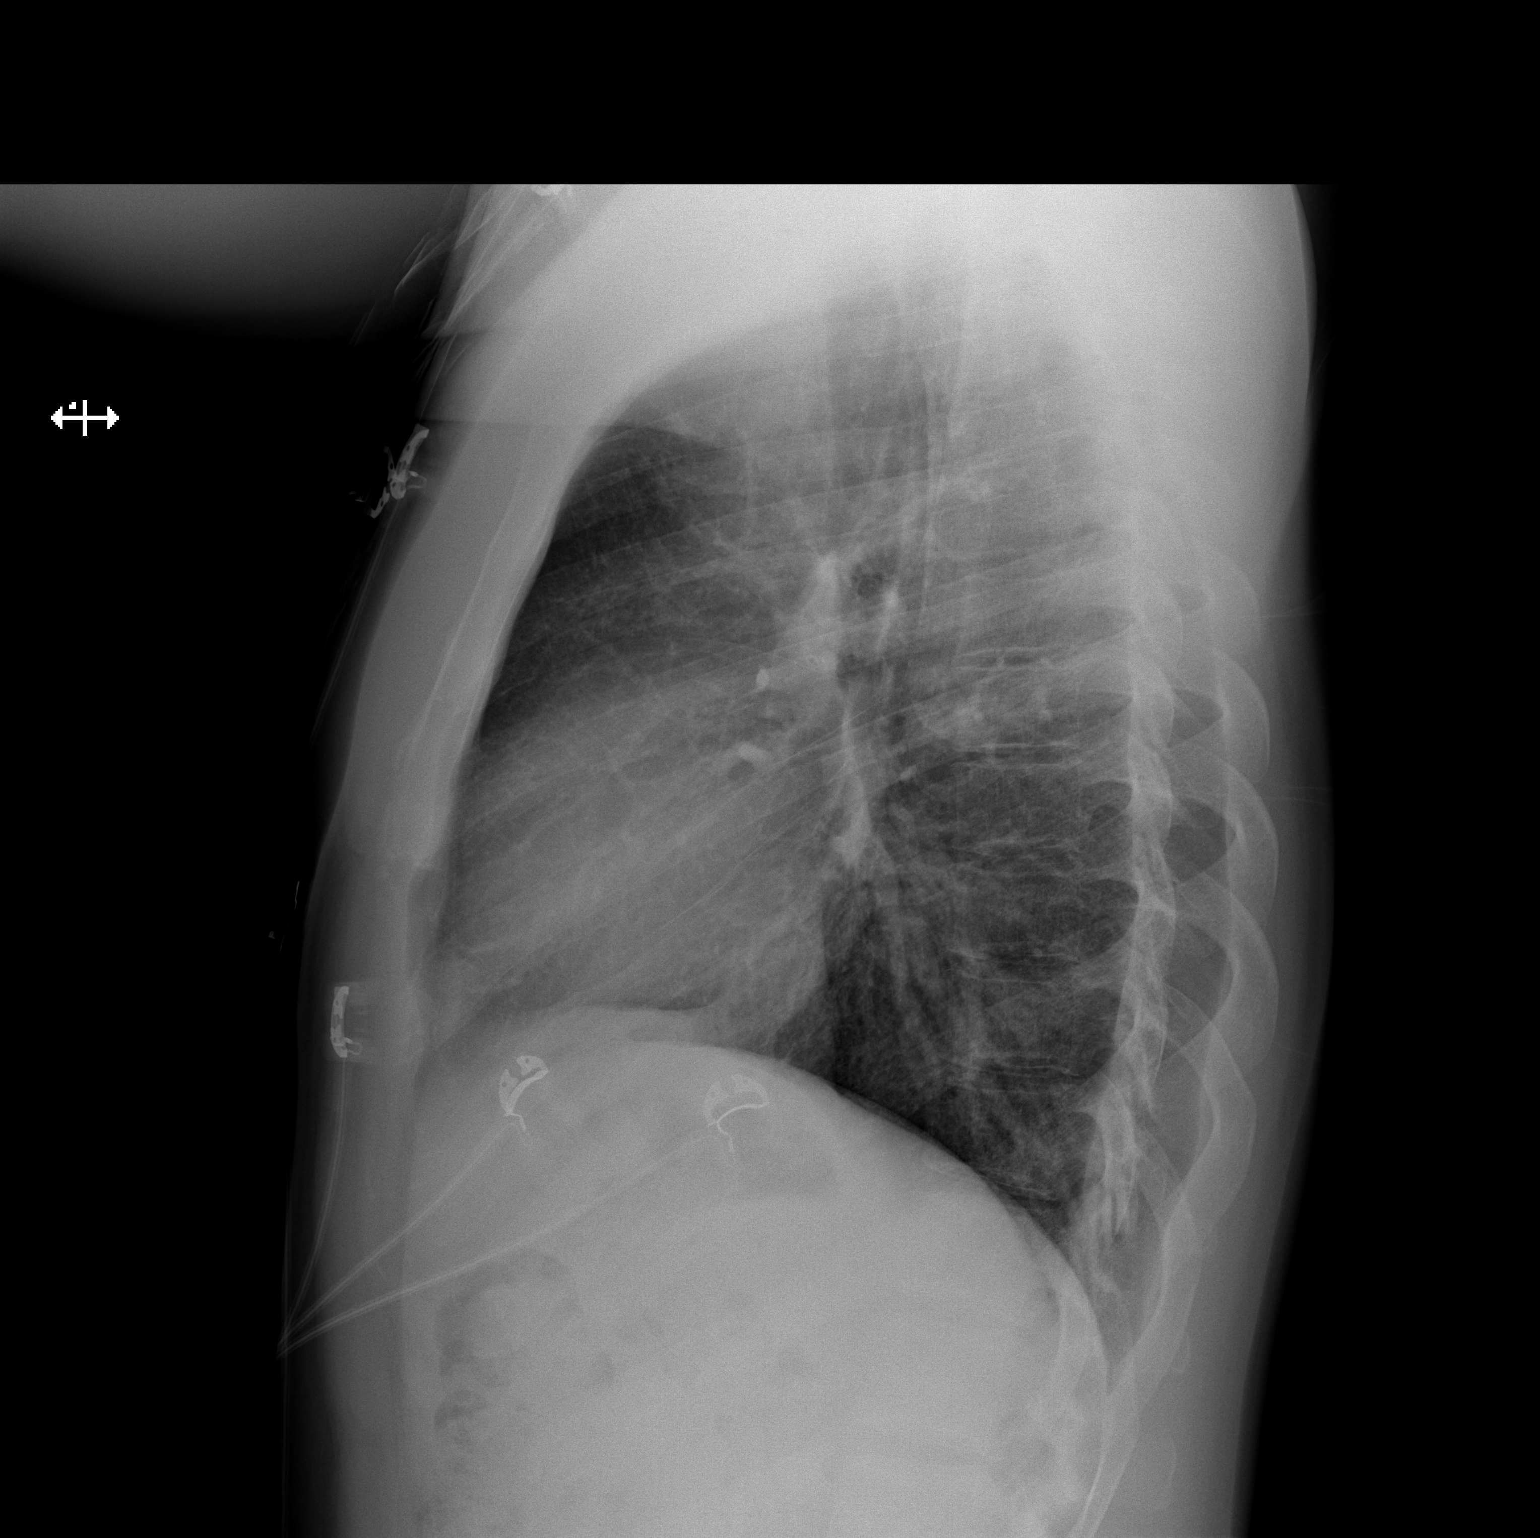

[2 of 2 positions shown; findings below may reference images not displayed]

FINDINGS: The heart size and mediastinal contours are within normal limits.
Both lungs are clear. The visualized skeletal structures are
unremarkable.
IMPRESSION: No active cardiopulmonary disease.
